# Patient Record
Sex: Female | Born: 1980 | Race: White | Hispanic: No | Marital: Married | Smoking: Current every day smoker
Health system: Southern US, Community
[De-identification: ages and names within clinical notes are randomized; demographics above are authoritative.]

## PROBLEM LIST (undated history)

## (undated) DIAGNOSIS — E282 Polycystic ovarian syndrome: Secondary | ICD-10-CM

## (undated) DIAGNOSIS — F419 Anxiety disorder, unspecified: Secondary | ICD-10-CM

## (undated) DIAGNOSIS — Z9089 Acquired absence of other organs: Secondary | ICD-10-CM

## (undated) DIAGNOSIS — E669 Obesity, unspecified: Secondary | ICD-10-CM

## (undated) DIAGNOSIS — H7193 Unspecified cholesteatoma, bilateral: Secondary | ICD-10-CM

## (undated) DIAGNOSIS — IMO0002 Reserved for concepts with insufficient information to code with codable children: Secondary | ICD-10-CM

## (undated) HISTORY — PX: MASTOIDECTOMY: SHX711

## (undated) HISTORY — PX: TUBAL LIGATION: SHX77

## (undated) HISTORY — PX: LAPAROSCOPIC OVARIAN: SHX5906

## (undated) HISTORY — PX: MASTOIDECTOMY: SUR855

## (undated) HISTORY — PX: DILATION AND CURETTAGE OF UTERUS: SHX78

## (undated) HISTORY — PX: MASTOIDECTOMY REVISION: SUR856

## (undated) HISTORY — DX: Reserved for concepts with insufficient information to code with codable children: IMO0002

---

## 2007-01-28 ENCOUNTER — Ambulatory Visit: Payer: Self-pay | Admitting: Internal Medicine

## 2007-09-15 ENCOUNTER — Ambulatory Visit: Payer: Self-pay | Admitting: Family Medicine

## 2007-09-29 ENCOUNTER — Ambulatory Visit: Payer: Self-pay | Admitting: Emergency Medicine

## 2008-07-23 ENCOUNTER — Ambulatory Visit: Payer: Self-pay | Admitting: Internal Medicine

## 2008-10-11 ENCOUNTER — Ambulatory Visit: Payer: Self-pay | Admitting: Family Medicine

## 2009-03-27 ENCOUNTER — Ambulatory Visit: Payer: Self-pay | Admitting: Internal Medicine

## 2009-05-15 ENCOUNTER — Ambulatory Visit: Payer: Self-pay | Admitting: Internal Medicine

## 2009-12-30 ENCOUNTER — Ambulatory Visit: Payer: Self-pay | Admitting: Internal Medicine

## 2011-03-28 ENCOUNTER — Ambulatory Visit: Payer: Self-pay | Admitting: Family Medicine

## 2011-08-17 ENCOUNTER — Ambulatory Visit: Payer: Self-pay | Admitting: Internal Medicine

## 2012-08-03 NOTE — L&D Delivery Note (Signed)
Delivery Note At 9:47 PM a viable female was delivered via Vaginal, Spontaneous Delivery (Presentation: ; Occiput Anterior).  APGAR: 7, 8; weight 2 lb 11 oz (1219 g).   Placenta status: Intact, Spontaneous.  Cord: 3 vessels.  Anesthesia:  Epidural Episiotomy: None Lacerations: Left periurethral Suture Repair: 3.0 vicryl, one stitch placed Est. Blood Loss (mL): 300 mL  Mom to postpartum.  Baby to NICU.  Tereso Newcomer, MD 06/28/2013, 10:15 PM

## 2012-09-06 ENCOUNTER — Ambulatory Visit: Payer: Self-pay | Admitting: Family Medicine

## 2012-10-18 ENCOUNTER — Ambulatory Visit: Payer: Self-pay | Admitting: Family Medicine

## 2013-01-15 ENCOUNTER — Emergency Department: Payer: Self-pay | Admitting: Internal Medicine

## 2013-01-15 LAB — COMPREHENSIVE METABOLIC PANEL
Albumin: 3.6 g/dL (ref 3.4–5.0)
Alkaline Phosphatase: 109 U/L (ref 50–136)
BUN: 10 mg/dL (ref 7–18)
Bilirubin,Total: 0.3 mg/dL (ref 0.2–1.0)
Creatinine: 0.74 mg/dL (ref 0.60–1.30)
EGFR (African American): >60
EGFR (Non-African Amer.): >60
Osmolality: 272 (ref 275–301)
Potassium: 3.7 mmol/L (ref 3.5–5.1)

## 2013-01-15 LAB — URINALYSIS, COMPLETE
Blood: NEGATIVE
Nitrite: NEGATIVE
Ph: 5 (ref 4.5–8.0)
RBC,UR: <1 /HPF (ref 0–5)
Specific Gravity: 1.013 (ref 1.003–1.030)
Squamous Epithelial: 3

## 2013-01-15 LAB — CBC
HCT: 44.1 % (ref 35.0–47.0)
MCV: 91 fL (ref 80–100)
RDW: 14.4 % (ref 11.5–14.5)
WBC: 16.9 10*3/uL — ABNORMAL HIGH (ref 3.6–11.0)

## 2013-01-15 LAB — HCG, QUANTITATIVE, PREGNANCY: Beta Hcg, Quant.: 7378 m[IU]/mL — ABNORMAL HIGH

## 2013-01-19 ENCOUNTER — Emergency Department: Payer: Self-pay | Admitting: Unknown Physician Specialty

## 2013-01-19 LAB — BASIC METABOLIC PANEL
BUN: 8 mg/dL (ref 7–18)
Calcium, Total: 9.1 mg/dL (ref 8.5–10.1)
Chloride: 106 mmol/L (ref 98–107)
Co2: 27 mmol/L (ref 21–32)
Creatinine: 0.8 mg/dL (ref 0.60–1.30)
EGFR (Non-African Amer.): >60
Potassium: 3.7 mmol/L (ref 3.5–5.1)
Sodium: 137 mmol/L (ref 136–145)

## 2013-01-19 LAB — URINALYSIS, COMPLETE
Bacteria: NONE SEEN
Bilirubin,UR: NEGATIVE
Blood: NEGATIVE
Ketone: NEGATIVE
RBC,UR: <1 /HPF (ref 0–5)
Specific Gravity: 1.01 (ref 1.003–1.030)
Squamous Epithelial: <1

## 2013-01-19 LAB — CBC
HGB: 14.3 g/dL (ref 12.0–16.0)
MCV: 91 fL (ref 80–100)
Platelet: 297 10*3/uL (ref 150–440)
WBC: 13.8 10*3/uL — ABNORMAL HIGH (ref 3.6–11.0)

## 2013-01-19 LAB — HCG, QUANTITATIVE, PREGNANCY: Beta Hcg, Quant.: 12446 m[IU]/mL — ABNORMAL HIGH

## 2013-02-07 LAB — OB RESULTS CONSOLE HEPATITIS B SURFACE ANTIGEN: Hepatitis B Surface Ag: NEGATIVE

## 2013-02-07 LAB — OB RESULTS CONSOLE VARICELLA ZOSTER ANTIBODY, IGG: Varicella: IMMUNE

## 2013-02-07 LAB — OB RESULTS CONSOLE RPR: RPR: NONREACTIVE

## 2013-02-07 LAB — OB RESULTS CONSOLE HIV ANTIBODY (ROUTINE TESTING): HIV: NONREACTIVE

## 2013-02-23 ENCOUNTER — Encounter: Payer: Self-pay | Admitting: Maternal & Fetal Medicine

## 2013-04-04 ENCOUNTER — Emergency Department: Payer: Self-pay | Admitting: Emergency Medicine

## 2013-04-04 LAB — URINALYSIS, COMPLETE
Bacteria: NONE SEEN
Bilirubin,UR: NEGATIVE
Blood: NEGATIVE
Glucose,UR: NEGATIVE mg/dL (ref 0–75)
Ketone: NEGATIVE
Nitrite: NEGATIVE
Protein: NEGATIVE
Specific Gravity: 1.002 (ref 1.003–1.030)
Squamous Epithelial: 1

## 2013-04-04 LAB — COMPREHENSIVE METABOLIC PANEL
Anion Gap: 5 — ABNORMAL LOW (ref 7–16)
BUN: 6 mg/dL — ABNORMAL LOW (ref 7–18)
Calcium, Total: 9.4 mg/dL (ref 8.5–10.1)
Chloride: 104 mmol/L (ref 98–107)
Co2: 24 mmol/L (ref 21–32)
Creatinine: 0.57 mg/dL — ABNORMAL LOW (ref 0.60–1.30)
EGFR (African American): >60
EGFR (Non-African Amer.): >60
SGOT(AST): 24 U/L (ref 15–37)
SGPT (ALT): 40 U/L (ref 12–78)
Sodium: 133 mmol/L — ABNORMAL LOW (ref 136–145)

## 2013-04-04 LAB — CBC
HCT: 40 % (ref 35.0–47.0)
MCH: 30.4 pg (ref 26.0–34.0)
MCV: 91 fL (ref 80–100)
Platelet: 308 10*3/uL (ref 150–440)
RBC: 4.38 10*6/uL (ref 3.80–5.20)
WBC: 19.5 10*3/uL — ABNORMAL HIGH (ref 3.6–11.0)

## 2013-04-04 LAB — LIPASE, BLOOD: Lipase: 103 U/L (ref 73–393)

## 2013-06-12 ENCOUNTER — Inpatient Hospital Stay: Payer: Self-pay | Admitting: Obstetrics and Gynecology

## 2013-06-12 LAB — CBC WITH DIFFERENTIAL/PLATELET
Basophil #: 0.1 10*3/uL (ref 0.0–0.1)
HCT: 37.3 % (ref 35.0–47.0)
HGB: 12.6 g/dL (ref 12.0–16.0)
MCH: 30.6 pg (ref 26.0–34.0)
MCHC: 33.7 g/dL (ref 32.0–36.0)
MCV: 91 fL (ref 80–100)
Monocyte #: 1.1 x10 3/mm — ABNORMAL HIGH (ref 0.2–0.9)
Neutrophil %: 79.5 %
Platelet: 359 10*3/uL (ref 150–440)
RBC: 4.1 10*6/uL (ref 3.80–5.20)
RDW: 14.1 % (ref 11.5–14.5)
WBC: 21 10*3/uL — ABNORMAL HIGH (ref 3.6–11.0)

## 2013-06-13 ENCOUNTER — Encounter (HOSPITAL_COMMUNITY): Payer: Self-pay | Admitting: *Deleted

## 2013-06-13 ENCOUNTER — Inpatient Hospital Stay (HOSPITAL_COMMUNITY)
Admission: AD | Admit: 2013-06-13 | Discharge: 2013-06-27 | DRG: 778 | Disposition: A | Discharge status: Home / Self Care | Payer: BC Managed Care – PPO | Source: Ambulatory Visit | Attending: Obstetrics & Gynecology | Admitting: Obstetrics & Gynecology

## 2013-06-13 DIAGNOSIS — O4700 False labor before 37 completed weeks of gestation, unspecified trimester: Principal | ICD-10-CM | POA: Diagnosis present

## 2013-06-13 DIAGNOSIS — O99330 Smoking (tobacco) complicating pregnancy, unspecified trimester: Secondary | ICD-10-CM | POA: Diagnosis present

## 2013-06-13 DIAGNOSIS — O99810 Abnormal glucose complicating pregnancy: Secondary | ICD-10-CM | POA: Diagnosis present

## 2013-06-13 DIAGNOSIS — O24419 Gestational diabetes mellitus in pregnancy, unspecified control: Secondary | ICD-10-CM

## 2013-06-13 HISTORY — DX: Obesity, unspecified: E66.9

## 2013-06-13 HISTORY — DX: Unspecified cholesteatoma, bilateral: H71.93

## 2013-06-13 HISTORY — DX: Acquired absence of other organs: Z90.89

## 2013-06-13 HISTORY — DX: Polycystic ovarian syndrome: E28.2

## 2013-06-13 HISTORY — DX: Anxiety disorder, unspecified: F41.9

## 2013-06-13 LAB — CBC WITH DIFFERENTIAL/PLATELET
Band Neutrophils: 0 % (ref 0–10)
Basophil #: 0.2 10*3/uL — ABNORMAL HIGH (ref 0.0–0.1)
Basophil %: 0.5 %
Blasts: 0 %
Eosinophil %: 0 %
HCT: 35.1 % (ref 35.0–47.0)
HGB: 11.5 g/dL — ABNORMAL LOW (ref 12.0–16.0)
Lymphocyte %: 7.1 %
Lymphocytes Relative: 9 % — ABNORMAL LOW (ref 12–46)
Lymphs Abs: 2.3 10*3/uL (ref 0.7–4.0)
MCH: 29.9 pg (ref 26.0–34.0)
MCHC: 33.2 g/dL (ref 30.0–36.0)
MCV: 91 fL (ref 80–100)
Monocyte #: 1 x10 3/mm — ABNORMAL HIGH (ref 0.2–0.9)
Monocyte %: 3.4 %
Monocytes Absolute: 0.5 10*3/uL (ref 0.1–1.0)
Monocytes Relative: 2 % — ABNORMAL LOW (ref 3–12)
Neutrophil %: 89 %
Neutrophils Relative %: 89 % — ABNORMAL HIGH (ref 43–77)
Platelet: 360 10*3/uL (ref 150–440)
Promyelocytes Absolute: 0 %
RBC: 3.86 10*6/uL (ref 3.80–5.20)
RBC: 3.77 MIL/uL — ABNORMAL LOW (ref 3.87–5.11)
RDW: 14.2 % (ref 11.5–14.5)
RDW: 14.1 % (ref 11.5–15.5)
WBC: 30.6 10*3/uL — ABNORMAL HIGH (ref 3.6–11.0)
WBC: 25.1 10*3/uL — ABNORMAL HIGH (ref 4.0–10.5)
nRBC: 0 /100 WBC

## 2013-06-13 LAB — COMPREHENSIVE METABOLIC PANEL
ALT: 23 U/L (ref 0–35)
Alkaline Phosphatase: 96 U/L (ref 39–117)
CO2: 23 mEq/L (ref 19–32)
Creatinine, Ser: 0.56 mg/dL (ref 0.50–1.10)
GFR calc Af Amer: >90 mL/min (ref >90)
GFR calc non Af Amer: >90 mL/min (ref >90)
Glucose, Bld: 254 mg/dL — ABNORMAL HIGH (ref 70–99)
Potassium: 4.1 mEq/L (ref 3.5–5.1)
Sodium: 133 mEq/L — ABNORMAL LOW (ref 135–145)
Total Bilirubin: 0.1 mg/dL — ABNORMAL LOW (ref 0.3–1.2)

## 2013-06-13 MED ORDER — DOCUSATE SODIUM 100 MG PO CAPS
100.0000 mg | ORAL_CAPSULE | Freq: Every day | ORAL | Status: DC
  Administered 2013-06-15 – 2013-06-27 (×13): 100 mg via ORAL
  Filled 2013-06-13 (×13): qty 1

## 2013-06-13 MED ORDER — LACTATED RINGERS IV SOLN
INTRAVENOUS | Status: DC
  Administered 2013-06-13 – 2013-06-14 (×2): via INTRAVENOUS

## 2013-06-13 MED ORDER — ZOLPIDEM TARTRATE 5 MG PO TABS
5.0000 mg | ORAL_TABLET | Freq: Every evening | ORAL | Status: DC | PRN
  Administered 2013-06-14 – 2013-06-20 (×5): 5 mg via ORAL
  Filled 2013-06-13 (×6): qty 1

## 2013-06-13 MED ORDER — PRENATAL MULTIVITAMIN CH
1.0000 | ORAL_TABLET | Freq: Every day | ORAL | Status: DC
  Administered 2013-06-15 – 2013-06-27 (×13): 1 via ORAL
  Filled 2013-06-13 (×13): qty 1

## 2013-06-13 MED ORDER — CALCIUM CARBONATE ANTACID 500 MG PO CHEW: 2.0000 | CHEWABLE_TABLET | ORAL | Status: DC | PRN

## 2013-06-13 MED ORDER — MAGNESIUM SULFATE 40 G IN LACTATED RINGERS - SIMPLE
2.0000 g/h | INTRAVENOUS | Status: AC
  Administered 2013-06-13: 2 g/h via INTRAVENOUS
  Filled 2013-06-13: qty 500

## 2013-06-13 NOTE — H&P (Signed)
FACULTY PRACTICE ANTEPARTUM ADMISSION HISTORY AND PHYSICAL NOTE   History of Present Illness: Dana Castillo is a 32 y.o. G4P0030 at [redacted]w[redacted]d transferred from [facility] for concern about preterm labor.  She was admitted yesterday after she was noted to have an advanced cervical exam of 5 cm dilatation with BBOW in the office.  She was given betamethasone and started on magnesium sulfate.  She was also started on Ampicillin.  She was seen by Dr. Pola Corn (Neonatology) who recommended transfer to [facility] in case she progressed in labor as there are more advanced equipment at this NICU to support  < 55 week old infants.  Patient currently denies having any contractions, vaginal bleeding or leaking of fluid. She endorses good FM.    Fetal presentation is cephalic by report, confirmed on bedside ultrasound upon arrival here at [facility].  Prenatal Course Source of Care: [employer] Milon Score CNM)  with onset of care at 10 weeks Pregnancy complications or risks: Patient Active Problem List   Diagnosis Date Noted  . Preterm labor 06/13/2013  Obesity BMI 36 SAB x 3 Hearing Deficits R60%, L30% Anxiety ASCUS +HPV pap on 02/13/13 Increased ROD 1:168 on first trimester screen; low risk Harmony; normal AFP  She plans to breastfeed.  She is undecided for postpartum contraception.   Prenatal labs and studies: ABO, Rh: A/Positive/-- (07/08 0000) Antibody: Negative (07/08 0000) Rubella: Immune (07/08 0000) RPR: Nonreactive (07/08 0000)  HBsAg: Negative (07/08 0000)  HIV: Non-reactive (07/08 0000)  GBS: Unknown 1 hr Glucola  Early 86 Genetic screening normal first trimester screening and Harmony testing Anatomy US normal  Patient Active Problem List   Diagnosis Date Noted  . Preterm labor 06/13/2013    Past Medical History  Diagnosis Date  . Anxiety     Past Surgical History  Procedure Laterality Date  . Dilation and curettage of uterus    Multiple ear surgeries  OB History   Grav Para Term  Preterm Abortions TAB SAB Ect Mult Living   4    3  3          History   Social History  . Marital Status: Single    Spouse Name: N/A    Number of Children: N/A  . Years of Education: N/A   Social History Main Topics  . Smoking status: Current Every Day Smoker -- 0.50 packs/day    Types: Cigarettes  . Smokeless tobacco: None  . Alcohol Use: No  . Drug Use: No  . Sexual Activity: No   Other Topics Concern  . None   Social History Narrative  . None    No family history on file.  Allergies  Allergen Reactions  . Codeine Nausea And Vomiting  . Vicodin [Hydrocodone-Acetaminophen] Itching    No prescriptions prior to admission    . docusate sodium  100 mg Oral Daily  . [START ON 06/14/2013] prenatal multivitamin  1 tablet Oral Q1200   I have reviewed the patient's current medications.  Review of Systems - Negative except what is mentioned in HPI  Vitals:  BP 135/66  Pulse 84  Resp 98  Ht 5\' 4"  (1.626 m)  Wt 226 lb (102.513 kg)  BMI 38.77 kg/m2 Physical Examination: General appearance - alert, well appearing, and in no distress Chest - clear to auscultation, no wheezes, rales or rhonchi, symmetric air entry Heart - normal rate and regular rhythm Abdomen - soft, obese, nontender, nondistended, no masses or organomegaly Pelvic - examination not indicated Extremities - peripheral pulses  normal, no pedal edema, no clubbing or cyanosis, 2+ DTRs, negative Homan's sign Fetal Monitoring:Baseline: 140 bpm, Variability: moderate, Accelerations: Reactive and Decelerations: Absent  Tocometry - flat Bedside ultrasound - Cephalic presentation  Assessment and Plan: Patient Active Problem List   Diagnosis Date Noted  . Preterm labor 06/13/2013  Admit to L&D for now, may transfer to Antenatal Unit later if stable Continue magnesium sulfate until she is 48 hours after initial BMZ dose (06/14/13 at 1630); then ascertain need for further tocolysis. If needed, Procardia can be  considered for further tocolysis No current signs of active preterm labor, will hold antibiotics for GBS prophylaxis for now.  Unable to test for GBS now as patient has already received multiple doses of Ampicillin. NICU and MFM consults ordered; will follow up recommendations Routine antenatal/L&D care   Jaynie Collins, MD, FACOG Attending Obstetrician & Gynecologist Faculty Practice, [facility] [place]

## 2013-06-14 ENCOUNTER — Encounter (HOSPITAL_COMMUNITY): Payer: Self-pay | Admitting: *Deleted

## 2013-06-14 ENCOUNTER — Inpatient Hospital Stay (HOSPITAL_COMMUNITY): Payer: BC Managed Care – PPO

## 2013-06-14 LAB — OB RESULTS CONSOLE GBS: GBS: NEGATIVE

## 2013-06-14 MED ORDER — ACETAMINOPHEN 325 MG PO TABS
650.0000 mg | ORAL_TABLET | Freq: Four times a day (QID) | ORAL | Status: DC | PRN
  Administered 2013-06-14 – 2013-06-24 (×9): 650 mg via ORAL
  Filled 2013-06-14 (×9): qty 2

## 2013-06-14 MED ORDER — NIFEDIPINE ER 30 MG PO TB24: 30.0000 mg | ORAL_TABLET | Freq: Two times a day (BID) | ORAL | Status: DC

## 2013-06-14 MED ORDER — NIFEDIPINE ER 30 MG PO TB24
30.0000 mg | ORAL_TABLET | Freq: Two times a day (BID) | ORAL | Status: DC
  Administered 2013-06-14 – 2013-06-27 (×26): 30 mg via ORAL
  Filled 2013-06-14 (×26): qty 1

## 2013-06-14 NOTE — Progress Notes (Signed)
FACULTY PRACTICE ANTEPARTUM COMPREHENSIVE PROGRESS NOTE  Dana Castillo is a 32 y.o. G4P0030 at [redacted]w[redacted]d who is admitted for Preterm labor.  Estimated Date of Delivery: 09/05/13 Fetal presentation is cephalic.  Length of Stay:  1 Days. 06/13/2013  Subjective: No sentinel events overnight. Patient reports good fetal movement.  She reports no uterine contractions, no bleeding and no loss of fluid per vagina.  Vitals:  Blood pressure 106/36, pulse 75, temperature 98.6 F (37 C), temperature source Oral, resp. rate 18, height 5\' 4"  (1.626 m), weight 226 lb (102.513 kg), last menstrual period 11/29/2012. Physical Examination: General appearance - alert, well appearing, and in no distress Chest - clear to auscultation, no wheezes, rales or rhonchi, symmetric air entry Heart - normal rate and regular rhythm Abdomen - soft, gravid, nontender, nondistended, no masses or organomegaly Cervical Exam: Not evaluated.  Extremities: extremities normal, atraumatic, no cyanosis or edema and Homans sign is negative, no sign of DVT with DTRs 2+ bilaterally Membranes:intact  Fetal Monitoring:  Baseline: 135 bpm, Variability: Moderate, Accelerations: Reactive and Decelerations: Absent Tocometry: Flat  Results for orders placed during the hospital encounter of 06/13/13 (from the past 48 hour(s))  TYPE AND SCREEN     Status: None   Collection Time    06/13/13  9:45 PM      Result Value Range   ABO/RH(D) A POS     Antibody Screen NEG     Sample Expiration 06/16/2013    COMPREHENSIVE METABOLIC PANEL     Status: Abnormal   Collection Time    06/13/13  9:45 PM      Result Value Range   Sodium 133 (*) 135 - 145 mEq/L   Potassium 4.1  3.5 - 5.1 mEq/L   Chloride 100  96 - 112 mEq/L   CO2 23  19 - 32 mEq/L   Glucose, Bld 254 (*) 70 - 99 mg/dL   BUN 5 (*) 6 - 23 mg/dL   Creatinine, Ser 1.61  0.50 - 1.10 mg/dL   Calcium 6.7 (*) 8.4 - 10.5 mg/dL   Total Protein 6.0  6.0 - 8.3 g/dL   Albumin 2.3 (*) 3.5 - 5.2  g/dL   AST 19  0 - 37 U/L   ALT 23  0 - 35 U/L   Alkaline Phosphatase 96  39 - 117 U/L   Total Bilirubin 0.1 (*) 0.3 - 1.2 mg/dL   GFR calc non Af Amer >90  >90 mL/min   GFR calc Af Amer >90  >90 mL/min   Comment: (NOTE)     The eGFR has been calculated using the CKD EPI equation.     This calculation has not been validated in all clinical situations.     eGFR's persistently <90 mL/min signify possible Chronic Kidney     Disease.  CBC WITH DIFFERENTIAL     Status: Abnormal   Collection Time    06/13/13  9:45 PM      Result Value Range   WBC 25.1 (*) 4.0 - 10.5 K/uL   Comment: REPEATED TO VERIFY     WHITE COUNT CONFIRMED ON SMEAR   RBC 3.77 (*) 3.87 - 5.11 MIL/uL   Hemoglobin 11.4 (*) 12.0 - 15.0 g/dL   HCT 09.6 (*) 04.5 - 40.9 %   MCV 91.0  78.0 - 100.0 fL   MCH 30.2  26.0 - 34.0 pg   MCHC 33.2  30.0 - 36.0 g/dL   RDW 81.1  91.4 - 78.2 %   Platelets  326  150 - 400 K/uL   Neutrophils Relative % 89 (*) 43 - 77 %   Lymphocytes Relative 9 (*) 12 - 46 %   Monocytes Relative 2 (*) 3 - 12 %   Eosinophils Relative 0  0 - 5 %   Basophils Relative 0  0 - 1 %   Band Neutrophils 0  0 - 10 %   Metamyelocytes Relative 0     Myelocytes 0     Promyelocytes Absolute 0     Blasts 0     nRBC 0  0 /100 WBC   Neutro Abs 22.3 (*) 1.7 - 7.7 K/uL   Lymphs Abs 2.3  0.7 - 4.0 K/uL   Monocytes Absolute 0.5  0.1 - 1.0 K/uL   Eosinophils Absolute 0.0  0.0 - 0.7 K/uL   Basophils Absolute 0.0  0.0 - 0.1 K/uL   WBC Morphology ABSOLUTE LYMPHOCYTOSIS    ABO/RH     Status: None   Collection Time    06/13/13  9:45 PM      Result Value Range   ABO/RH(D) A POS     Imaging: No results found.  Current scheduled medications . docusate sodium  100 mg Oral Daily  . prenatal multivitamin  1 tablet Oral Q1200   I have reviewed the patient's current medications.  ASSESSMENT: Patient Active Problem List   Diagnosis Date Noted  . Preterm labor 06/13/2013  Leukocytosis, but no overt signs of  chorioamnionitis  PLAN: No progression of preterm labor; cervical exam not done as it is not indicated Elevated WBC with left shift, but no uterine tenderness, no maternal/fetal tachycardia; will continue to monitor for overt signs of chorioamnionitis as this will be an indication for delivery May transfer to Antenatal Unit later if remains stable  Continue magnesium sulfate until she is 48 hours after initial BMZ dose (06/14/13 at 1630); then ascertain need for further tocolysis. If needed, Procardia can be considered for further tocolysis  Will hold antibiotics for GBS prophylaxis for now. Will have team/RN call [facility] and check for GBS status, patient reports this was checked at admission Will follow up recommendations from MFM, will also follow up ultrasound results  Will continue routine antenatal care.    Jaynie Collins, MD, FACOG Attending Obstetrician & Gynecologist Faculty Practice, [facility]

## 2013-06-14 NOTE — Anesthesia Preprocedure Evaluation (Signed)
Anesthesia Evaluation  Patient identified by MRN, date of birth, ID band Patient awake    Reviewed: Allergy & Precautions, H&P , Patient's Chart, lab work & pertinent test results  Airway Mallampati: III TM Distance: >3 FB Neck ROM: Full    Dental no notable dental hx. (+) Teeth Intact   Pulmonary neg pulmonary ROS, Current Smoker,  breath sounds clear to auscultation  Pulmonary exam normal       Cardiovascular negative cardio ROS  Rhythm:Regular Rate:Normal     Neuro/Psych Anxiety negative psych ROS   GI/Hepatic negative GI ROS, Neg liver ROS,   Endo/Other  negative endocrine ROSMorbid obesityPCOS  Renal/GU negative Renal ROS  negative genitourinary   Musculoskeletal negative musculoskeletal ROS (+)   Abdominal (+) + obese,   Peds  Hematology   Anesthesia Other Findings   Reproductive/Obstetrics (+) Pregnancy                           Anesthesia Physical Anesthesia Plan  ASA: III  Anesthesia Plan: Epidural   Post-op Pain Management:    Induction:   Airway Management Planned: Natural Airway  Additional Equipment:   Intra-op Plan:   Post-operative Plan:   Informed Consent: I have reviewed the patients History and Physical, chart, labs and discussed the procedure including the risks, benefits and alternatives for the proposed anesthesia with the patient or authorized representative who has indicated his/her understanding and acceptance.     Plan Discussed with: Anesthesiologist  Anesthesia Plan Comments:         Anesthesia Quick Evaluation

## 2013-06-14 NOTE — Consult Note (Signed)
Asked by Dr Macon Large to consult on Dana Castillo to provide neonatal info due to PTL at 28 weeks. Patient was seen by Dr Wayland Denis, on 06/12/13 in pm at [facility]. I can provide you a copy of the consult if this is helpful. If needed, we can also provide a follow up consult on this patient.  Thank you.  Lucillie Garfinkel, MD Neonatologist

## 2013-06-14 NOTE — Progress Notes (Signed)
I received a referral from RN to see pt to discuss her wedding plans--she had planned to go [place] [employer] [place] to be married but had not anticipated being in the hospital.  I made an initial contact with pt to talk about her wedding plans.  Staff are involved and AC, Okey Regal, has a phone number to reach me to discuss plans!  I will consult with Chaplain Rush Barer who will be here tomorrow.  [employer] Pager, [phone] 12:23 PM

## 2013-06-14 NOTE — Consult Note (Signed)
Neonatology consultation done 06/12/13 ar [facility]  Asked by Dr. Feliberto Gottron to provide prenatal consultation for this 32 y.o  G4 P 0 Ab 3 mother  at 100 5/[redacted] wks EGA after she was admitted in preterm labor with bulging membranes and cervical dilatation to 5 cm.  She has no signs of infection and she has been given a dose of betamethasone and started on Magnesium sulfate and antibiotics.  Talked with patient and the FOB about usual treatment and expectations for preterm infants at 50 - 28 wks, including possible need for resuscitation, respiratory support, long-term IV access, hypothermia, feeding problems, and antibiotics.  Told them most babies born at this GA survived without major complications, but that there were many potential problems. Presented uncertain LOS, possibly until 36 - [redacted] wks EGA.  Discussed desirability of feeding mother's milk - she plans to pump after delivery.  Patient and FOB were attentive and asked appropriate questions and expressed appreciation for my input.  Thank you for consulting Neonatology   JWimmer,MD  face-to-face time 20 minutes

## 2013-06-14 NOTE — Progress Notes (Signed)
Inpatient Diabetes Program Recommendations  AACE/ADA: New Consensus Statement on Inpatient Glycemic Control (2013)  Target Ranges:  Prepandial:   less than 140 mg/dL      Peak postprandial:   less than 180 mg/dL (1-2 hours)      Critically ill patients:  140 - 180 mg/dL   Reason for Visit: Noted elevated blood glucose of 254 mg/dl on 47/82/95 @ 62:13.  Results for JACKOLYN, GERON (MRN [id number]) as of 06/14/2013 07:23  Ref. Range 06/13/2013 21:45  Glucose Latest Range: 70-99 mg/dL 629 (H)   Inpatient Diabetes Program Recommendations Correction (SSI): Please consider ordering CBGs before meals, 2 hours post prandial, at bedtime, and at 3:00 am.  Nursing: If fasting glucose >90 mg/dl or any other glucose values >120 mg/dl then call MD for glycemic control order set. HgbA1C: Please consider ordering an A1C to determine glycemic control over the past 2-3 months. Diet: Please consider changing diet to Gestational Carb Modified and if appropriate consult RD for diet education.  Note: Noted blood glucose of 254 mg/dl on labs yesterday.  In reviewing the chart, patient does not have a documented history of diabetes and does not take any medications as an outpatient for diabetes management.  Please consider ordering an A1C to determine glycemic control over the past 2-3 months.  Please order CBGs as requested and consider changing diet to Gestational Carb Modified diet.    Thanks, Orlando Penner, RN, MSN, CCRN Diabetes Coordinator Inpatient Diabetes Program [phone] (Team Pager) [phone] (AP office) [phone] [facility] office)

## 2013-06-14 NOTE — Consult Note (Signed)
MFM Note  [name] is a 32 y/o G51P0A3 Caucasian female at 28+1 weeks who was transferred to [facility] last night from [facility]  for advanced cervical dilation. Her prenatal course had been uneventful until Monday afternoon when she presented to the [facility] office c/o a clear mucous discharge x 2-3 day with only a rare contraction. On digital exam, her cervix was found to be 5 cms dilated. She was admitted at that time to [facility] and was given a course of BMZ and magnesium. Dr. Eric Castillo as consulted and suggested she be transferred. Her magnesium was continued until this afternoon (48 hours).  Currently, she denies any contractions. Reports excellent fetal movement. Had low risk NIPS after an abnormal first trimester screen  Ultrasound today: (suboptimal study); no gross abnormalities; normal AFV, EFW at the 23rd %tile; cervix not evaluated  OB history: 3 SABs Med history: NA; Surg history: D&Cs Allergies: none; Codeine - GI disturbance; Vicodin - itching Social history: works as a Archivist; smokes between 1/2 to 1 ppd; no ETOH or drugs  Assessment: 1) IUP at 28+1 weeks 2) Advanced cervical dilation without significant uterine activity 3) History of recurrent abortions 4) S/P BMZ and magnesium 5) GBS - negative 6) Tobacco use  Recommendations: 1) Endovaginal [place] to evaluate cervix/membranes this Friday at [facility]; also attempt to visualize more fetal anatomy 2) If no change in cervical exam in several days and no significant uterine activity, consider outpt management; she lives close to [facility] and is a compliant patient 3) Observe for s/s of IU infection 4) Encourage tobacco cessation  Thank you for the kind referral. Call with questions or concerns.  (Face-to-face consultation with patient: 30 min)

## 2013-06-14 NOTE — Progress Notes (Signed)
Dana Castillo is a 32 y.o. G4P0030 at [redacted]w[redacted]d admitted for threatened PTL  Subjective:  Pt comfortable after sleeping all night and napping this AM.  She is not feeling any contractions. No vb, lof. +FM.   Objective: BP 122/58  Pulse 80  Temp(Src) 98.8 F (37.1 C) (Oral)  Resp 20  Ht 5\' 4"  (1.626 m)  Wt 102.513 kg (226 lb)  BMI 38.77 kg/m2  LMP 11/29/2012 I/O last 3 completed shifts: In: 1726.7 [P.O.:840; I.V.:886.7] Out: 1395 [Urine:1395] Total I/O In: 540 [P.O.:240; I.V.:300] Out: 400 [Urine:400]  FHT:  FHR: 130 bpm, variability: moderate,  accelerations:  Present,  decelerations:  Absent UC:   One in the last several hours but not felt by pt SVE:   Dilation: 5  Labs: Lab Results  Component Value Date   WBC 25.1* 06/13/2013   HGB 11.4* 06/13/2013   HCT 34.3* 06/13/2013   MCV 91.0 06/13/2013   PLT 326 06/13/2013    Assessment / Plan: threatened PTL  1) threatened PTL - s/p BMZ x2 - will plan to d/c tocolytic mag at 1630  - transfer to antenatal  2) FWB - cat I tracing - GBS collected at [facility] - called and spoke to lab and result was negative    [name] L 06/14/2013, 11:34 AM

## 2013-06-15 ENCOUNTER — Encounter (HOSPITAL_COMMUNITY): Payer: Self-pay | Admitting: *Deleted

## 2013-06-15 DIAGNOSIS — O99330 Smoking (tobacco) complicating pregnancy, unspecified trimester: Secondary | ICD-10-CM

## 2013-06-15 DIAGNOSIS — O4700 False labor before 37 completed weeks of gestation, unspecified trimester: Secondary | ICD-10-CM

## 2013-06-15 DIAGNOSIS — O99810 Abnormal glucose complicating pregnancy: Secondary | ICD-10-CM

## 2013-06-15 LAB — GLUCOSE, CAPILLARY: Glucose-Capillary: 76 mg/dL (ref 70–99)

## 2013-06-15 NOTE — Progress Notes (Signed)
06/15/13 1500  Clinical Encounter Type  Visited With Patient;Health care provider Candida Peeling, RN)  Visit Type Follow-up;Spiritual support;Social support  Referral From Chaplain  Recommendations Spiritual Care is helping pt plan wedding & plans to officiate.  Spiritual Encounters  Spiritual Needs Emotional;Ritual (planning wedding)   Ayshia was in good spirits during this follow-up visit, coping well with her unexpected and rush admission to [facility].  We discussed many details possible for her wedding, which she hopes to hold in the chapel (pending MD approval) on Sunday afternoon.  Milarose plans to discuss with her fiance Vernard Gambles, and then we will make a firmer plan together tomorrow.  Criss Alvine in dietary is on board with a plan for cake and a special meal for the couple, and she will be working on Sunday. Please page as further spiritual/emotional/ritual needs arise:  [phone].  Thank you.  [address] [place], [place] [phone]

## 2013-06-15 NOTE — Progress Notes (Signed)
UR completed 

## 2013-06-15 NOTE — Progress Notes (Signed)
FACULTY PRACTICE ANTEPARTUM(COMPREHENSIVE) NOTE  Dana Castillo is a 32 y.o. G4P0030 at [redacted]w[redacted]d  who is admitted for threatened PTL, .Advanced cervical dilation , without current uterine activity   Fetal presentation is cephalic. Length of Stay:  2  Days  Subjective: No uterine activity Patient reports the fetal movement as active. Patient reports uterine contraction  activity as none. Patient reports  vaginal bleeding as none. Patient describes fluid per vagina as None.  Vitals:  Blood pressure 114/49, pulse 68, temperature 98.3 F (36.8 C), temperature source Oral, resp. rate 18, height 5\' 4"  (1.626 m), weight 102.513 kg (226 lb), last menstrual period 11/29/2012. Physical Examination:  General appearance - alert, well appearing, and in no distress Heart - normal rate and regular rhythm Abdomen - soft, nontender, nondistended Fundal Height:  size equals dates Cervical Exam: Not evaluated. Previously 5/BBOW Extremities: extremities normal, atraumatic, no cyanosis or edema and Homans sign is negative, no sign of DVT with DTRs 2+ bilaterally Membranes:intact  Fetal Monitoring:  Nst q shift Labs:  Results for orders placed during the hospital encounter of 06/13/13 (from the past 24 hour(s))  GLUCOSE, CAPILLARY   Collection Time    06/15/13  7:43 AM      Result Value Range   Glucose-Capillary 76  70 - 99 mg/dL    Imaging Studies:    *  Medications:  Scheduled . docusate sodium  100 mg Oral Daily  . NIFEdipine  30 mg Oral BID  . prenatal multivitamin  1 tablet Oral Q1200   I have reviewed the patient's current medications.  ASSESSMENT: Patient Active Problem List   Diagnosis Date Noted  . Preterm labor 06/13/2013  Adv cervical dilation  PLAN: MFM repeat u/s Friday. Consider outpt management if stable after Friday u.s, versus prolonged inpt care  Kalimah Capurro V 06/15/2013,7:56 AM

## 2013-06-15 NOTE — Progress Notes (Signed)
Epidural charted on wrong patient. Pt is without epidural or epidural consult.

## 2013-06-16 ENCOUNTER — Inpatient Hospital Stay (HOSPITAL_COMMUNITY): Payer: BC Managed Care – PPO

## 2013-06-16 NOTE — Progress Notes (Signed)
Patient ID: Dana Castillo, female   DOB: 09/25/1980, 32 y.o.   MRN: [id number] FACULTY PRACTICE ANTEPARTUM(COMPREHENSIVE) NOTE  Dana Castillo is a 32 y.o. G4P0030 at [redacted]w[redacted]d by LMP who is admitted for Preterm labor.   Fetal presentation is cephalic. Length of Stay:  3  Days  Subjective: No contractions Patient reports the fetal movement as active. Patient reports uterine contraction  activity as none. Patient reports  vaginal bleeding as none. Patient describes fluid per vagina as Clear.mucus  Vitals:  Blood pressure 124/56, pulse 76, temperature 98.3 F (36.8 C), temperature source Oral, resp. rate 18, height 5\' 4"  (1.626 m), weight 226 lb (102.513 kg), last menstrual period 11/29/2012. Physical Examination:  General appearance - alert, well appearing, and in no distress Heart - normal rate and regular rhythm Abdomen - soft, nontender, nondistended Fundal Height:  size equals dates Cervical Exam: Not evaluated Extremities: extremities normal, atraumatic, no cyanosis or edema and Homans sign is negative, no sign of DVT Membranes:intact  Fetal Monitoring:  Baseline: 145 bpm, Variability: Good {> 6 bpm) and Accelerations: Reactive  Labs:  No results found for this or any previous visit (from the past 24 hour(s)).  Imaging Studies:     Currently EPIC will not allow sonographic studies to automatically populate into notes.  In the meantime, copy and paste results into note or free text.  Medications:  Scheduled . docusate sodium  100 mg Oral Daily  . NIFEdipine  30 mg Oral BID  . prenatal multivitamin  1 tablet Oral Q1200   I have reviewed the patient's current medications.  ASSESSMENT: Patient Active Problem List   Diagnosis Date Noted  . Preterm labor 06/13/2013  Advanced dilation  PLAN: MFM [place] today  Dana Castillo 06/16/2013,8:10 AM

## 2013-06-16 NOTE — Progress Notes (Signed)
06/16/13 1700  Clinical Encounter Type  Visited With Patient  Visit Type (plans for wedding in chapel, Sunday 11/16 at 2pm)  Spiritual Encounters  Spiritual Needs Emotional;Ritual (planning wedding)   Visited with Dana Castillo twice this afternoon to discuss details and simple arrangements for her wedding in the hospital chapel on Sunday.    Have consulted with many staff members in making preparations, per Dana Castillo/hospital procedure, including  RNs, Dr Debroah Loop, Security, Criss Alvine in Dietary, Meta Hatchet - VP of Nursing, Magda Bernheim in Marketing.  Plan is for Dana Castillo to be present in chapel (in wheelchair) with 10-12 family members for a very simple service and then cake just outside the chapel, ca one hour (or less) total.  I plan to be at the hospital by 1:00pm on Sunday and to check in with ante and Dana Castillo/family by 1:30pm. Staff may reach me by personal cell (number at ante desk) with questions.  Thank you!  [address] [place], [place] [phone]

## 2013-06-17 NOTE — Progress Notes (Signed)
Patient ID: Dana Castillo, female   DOB: 08/25/1980, 32 y.o.   MRN: [id number] Patient ID: Dana Castillo, female   DOB: 23-Sep-1980, 32 y.o.   MRN: [id number] FACULTY PRACTICE ANTEPARTUM(COMPREHENSIVE) NOTE  Dana Castillo is a 32 y.o. G4P0030 at 43w4dby LMP who is admitted for Preterm labor.   Fetal presentation is cephalic. Length of Stay:  4  Days 06/13/2013  7:18 PM  Subjective: No contractions Patient reports the fetal movement as active. Patient reports uterine contraction  activity as none. Patient reports  vaginal bleeding as none. Patient describes fluid per vagina as Clear.mucus  Vitals:  Blood pressure 140/61, pulse 90, temperature 98.5 F (36.9 C), temperature source Oral, resp. rate 18, height 5\' 4"  (1.626 m), weight 226 lb (102.513 kg), last menstrual period 11/29/2012. Physical Examination:  General appearance - alert, well appearing, and in no distress Heart - normal rate and regular rhythm Abdomen - soft, nontender, nondistended Fundal Height:  size equals dates Cervical Exam: Not evaluated Extremities: extremities normal, atraumatic, no cyanosis or edema and Homans sign is negative, no sign of DVT Membranes:intact  Fetal Monitoring:  Baseline: 145 bpm, Variability: Good {> 6 bpm) and Accelerations: Reactive  Labs:  No results found for this or any previous visit (from the past 24 hour(s)).  Imaging Studies:     Currently EPIC will not allow sonographic studies to automatically populate into notes.  In the meantime, copy and paste results into note or free text.  Medications:  Scheduled . docusate sodium  100 mg Oral Daily  . NIFEdipine  30 mg Oral BID  . prenatal multivitamin  1 tablet Oral Q1200   I have reviewed the patient's current medications.  ASSESSMENT: Patient Active Problem List   Diagnosis Date Noted  . Preterm labor 06/13/2013  Advanced dilation  Continue current care plan  Jessicaann Overbaugh H 06/17/2013,10:30 PM

## 2013-06-18 DIAGNOSIS — O4700 False labor before 37 completed weeks of gestation, unspecified trimester: Principal | ICD-10-CM

## 2013-06-18 MED ORDER — POLYETHYLENE GLYCOL 3350 17 G PO PACK
17.0000 g | PACK | Freq: Two times a day (BID) | ORAL | Status: DC
  Administered 2013-06-18 – 2013-06-24 (×12): 17 g via ORAL
  Filled 2013-06-18 (×18): qty 1

## 2013-06-18 NOTE — Progress Notes (Signed)
Patient ID: Dana Castillo, female   DOB: 08-Jan-1981, 32 y.o.   MRN: [id number] FACULTY PRACTICE ANTEPARTUM(COMPREHENSIVE) NOTE  Dana Castillo is a 32 y.o. G4P0030 at [redacted]w[redacted]d by LMP who is admitted for Preterm labor.   Fetal presentation is cephalic. Length of Stay:  5  Days 06/13/2013  7:18 PM  Subjective: No contractions Patient reports the fetal movement as active. Patient reports uterine contraction  activity as none. Patient reports  vaginal bleeding as none. Patient describes fluid per vagina as Clear.mucus  Vitals:  Blood pressure 119/57, pulse 79, temperature 98.5 F (36.9 C), temperature source Oral, resp. rate 20, height 5\' 4"  (1.626 m), weight 226 lb (102.513 kg), last menstrual period 11/29/2012. Physical Examination:  General appearance - alert, well appearing, and in no distress Heart - normal rate and regular rhythm Abdomen - soft, nontender, nondistended Fundal Height:  size equals dates Cervical Exam: Not evaluated Extremities: extremities normal, atraumatic, no cyanosis or edema and Homans sign is negative, no sign of DVT Membranes:intact  Fetal Monitoring:  Baseline: 150 bpm, Variability: Good {> 6 bpm), Accelerations: Reactive and Decelerations: Absent Toco: no contractions  Labs:  No results found for this or any previous visit (from the past 24 hour(s)).  Imaging Studies:     Currently EPIC will not allow sonographic studies to automatically populate into notes.  In the meantime, copy and paste results into note or free text.  Medications:  Scheduled . docusate sodium  100 mg Oral Daily  . NIFEdipine  30 mg Oral BID  . prenatal multivitamin  1 tablet Oral Q1200   I have reviewed the patient's current medications.  ASSESSMENT: Patient Active Problem List   Diagnosis Date Noted  . Preterm labor 06/13/2013  Advanced dilation   Continue current care plan Patient already s/p mag sulfate for CP prophylaxis and BMZ Nasiah Polinsky 06/18/2013,7:05  AM

## 2013-06-18 NOTE — Progress Notes (Signed)
Patient off unit to chapel for wedding ceremony.

## 2013-06-19 LAB — URINALYSIS, ROUTINE W REFLEX MICROSCOPIC
Bilirubin Urine: NEGATIVE
Glucose, UA: NEGATIVE mg/dL
Ketones, ur: NEGATIVE mg/dL
Urobilinogen, UA: 0.2 mg/dL (ref 0.0–1.0)
pH: 6.5 (ref 5.0–8.0)

## 2013-06-19 LAB — URINE MICROSCOPIC-ADD ON

## 2013-06-19 NOTE — Progress Notes (Signed)
Patient ID: Dana Castillo, female   DOB: February 18, 1981, 32 y.o.   MRN: [id number] Patient ID: Dana Castillo, female   DOB: [birth year], 32 y.o.   MRN: [id number] Patient ID: Dana Castillo, female   DOB: 29-Aug-1980, 32 y.o.   MRN: [id number] FACULTY PRACTICE ANTEPARTUM(COMPREHENSIVE) NOTE  Dana Castillo is a 32 y.o. G4P0030 at [redacted]w[redacted]d by LMP who is admitted for Preterm labor.   Fetal presentation is cephalic. Length of Stay:  6  Days 06/13/2013  7:18 PM  Subjective: No contractions, some back ache which is chronic, some frequency Patient reports the fetal movement as active. Patient reports uterine contraction  activity as none. Patient reports  vaginal bleeding as none. Patient describes fluid per vagina as Clear.mucus  Vitals:  Blood pressure 114/53, pulse 81, temperature 98.4 F (36.9 C), temperature source Oral, resp. rate 18, height 5\' 4"  (1.626 m), weight 226 lb (102.513 kg), last menstrual period 11/29/2012. Physical Examination:  General appearance - alert, well appearing, and in no distress Heart - normal rate and regular rhythm Abdomen - soft, nontender, nondistended Fundal Height:  size equals dates Cervical Exam: Not evaluated Extremities: extremities normal, atraumatic, no cyanosis or edema and Homans sign is negative, no sign of DVT Membranes:intact  Fetal Monitoring:  Baseline: 145 bpm, Variability: Good {> 6 bpm) and Accelerations: Reactive  Labs:  No results found for this or any previous visit (from the past 24 hour(s)).  Imaging Studies:     Currently EPIC will not allow sonographic studies to automatically populate into notes.  In the meantime, copy and paste results into note or free text.  Medications:  Scheduled . docusate sodium  100 mg Oral Daily  . NIFEdipine  30 mg Oral BID  . polyethylene glycol  17 g Oral BID  . prenatal multivitamin  1 tablet Oral Q1200   I have reviewed the patient's current medications.  ASSESSMENT: Patient Active Problem List   Diagnosis  Date Noted  . Preterm labor 06/13/2013  Advanced dilation  Continue current care plan: Status post Betamethasone and Magnesium prophylaxis Neonatology consult done  Hildegarde Dunaway H 06/19/2013,7:52 AM

## 2013-06-19 NOTE — Progress Notes (Signed)
Right wrist from previous IV site swollen and red tender and warm to touch

## 2013-06-19 NOTE — Progress Notes (Signed)
I had a brief visit with Dana Castillo today.  She was tired from not having slept well the night before, but she was in good spirits.  She reported that she was so grateful for everyone who worked hard to make her wedding here at the hospital special yesterday.    She is remaining optimistic about keeping her baby in for a little longer and we will continue to follow her for support.  [address] [place] Pager, [phone] 3:43 PM   06/19/13 1500  Clinical Encounter Type  Visited With Patient  Visit Type Follow-up

## 2013-06-19 NOTE — Progress Notes (Signed)
Antenatal Nutrition Assessment:  Currently  28 6/[redacted] weeks gestation, with PTL, cervical changes. Height  64" Weight 226 lbs pre-pregnancy weight unknown.Pre-pregnancy  BMI unknown  IBW 120 lbs  Total weight gain unknown. Weight gain goals 11-20 lbs.   Estimated needs: 2000-2200 kcal/day, 74-84 grams protein/day, 2.3 liters fluid/day  Antenatal regular diet tolerated well, appetite good. Pt asleep at time of visit. Left copies of snack and retail menus at bedside Current diet prescription will provide for increased needs.  No abnormal nutrition related labs  Nutrition Dx: Increased nutrient needs r/t pregnancy and fetal growth requirements aeb [redacted] weeks gestation.  No educational needs assessed at this time.  Elisabeth Cara M.Odis Luster LDN Neonatal Nutrition Support Specialist Pager [phone]

## 2013-06-19 NOTE — Progress Notes (Signed)
Ur chart review completed.  

## 2013-06-20 NOTE — Progress Notes (Signed)
Patient ID: Dana Castillo, female   DOB: June 01, 1981, 32 y.o.   MRN: [id number] FACULTY PRACTICE ANTEPARTUM(COMPREHENSIVE) NOTE  Dana Castillo is a 32 y.o. G4P0030 at [redacted]w[redacted]d by best clinical estimate who is admitted for Preterm labor.   Fetal presentation is cephalic. Length of Stay:  7  Days  Subjective: Doing well. Patient reports the fetal movement as active. Patient reports uterine contraction  activity as none. Patient reports  vaginal bleeding as none. Patient describes fluid per vagina as None.  Vitals:  Blood pressure 123/47, pulse 78, temperature 98.4 F (36.9 C), temperature source Oral, resp. rate 18, height 5\' 4"  (1.626 m), weight 226 lb (102.513 kg), last menstrual period 11/29/2012. Physical Examination:  General appearance - alert, well appearing, and in no distress Abdomen - soft, nontender, nondistended, no masses or organomegaly Fundal Height:  size equals dates Pelvic Exam:  normal external genitalia, vulva, vagina, cervix, uterus and adnexa Extremities: extremities normal, atraumatic, no cyanosis or edema  Membranes:intact  Fetal Monitoring:  Baseline: 150 bpm, Variability: Good {> 6 bpm), Accelerations: Non-reactive but appropriate for gestational age and Decelerations: Absent  Labs:  Results for orders placed during the hospital encounter of 06/13/13 (from the past 24 hour(s))  URINALYSIS, ROUTINE W REFLEX MICROSCOPIC   Collection Time    06/19/13  9:40 AM      Result Value Range   Color, Urine YELLOW  YELLOW   APPearance CLEAR  CLEAR   Specific Gravity, Urine 1.025  1.005 - 1.030   pH 6.5  5.0 - 8.0   Glucose, UA NEGATIVE  NEGATIVE mg/dL   Hgb urine dipstick SMALL (*) NEGATIVE   Bilirubin Urine NEGATIVE  NEGATIVE   Ketones, ur NEGATIVE  NEGATIVE mg/dL   Protein, ur NEGATIVE  NEGATIVE mg/dL   Urobilinogen, UA 0.2  0.0 - 1.0 mg/dL   Nitrite NEGATIVE  NEGATIVE   Leukocytes, UA SMALL (*) NEGATIVE  URINE MICROSCOPIC-ADD ON   Collection Time    06/19/13  9:40  AM      Result Value Range   Squamous Epithelial / LPF FEW (*) RARE   WBC, UA 7-10  <3 WBC/hpf   RBC / HPF 3-6  <3 RBC/hpf   Bacteria, UA RARE  RARE     Medications:  Scheduled . docusate sodium  100 mg Oral Daily  . NIFEdipine  30 mg Oral BID  . polyethylene glycol  17 g Oral BID  . prenatal multivitamin  1 tablet Oral Q1200   I have reviewed the patient's current medications.  ASSESSMENT: Patient Active Problem List   Diagnosis Date Noted  . Preterm labor 06/13/2013    PLAN: Continue inpt. Stay for now.  PRATT,TANYA S 06/20/2013,7:28 AM

## 2013-06-21 MED ORDER — SODIUM CHLORIDE 0.9 % IJ SOLN
3.0000 mL | Freq: Two times a day (BID) | INTRAMUSCULAR | Status: DC
  Administered 2013-06-21: 3 mL via INTRAVENOUS

## 2013-06-21 NOTE — Progress Notes (Signed)
Patient ID: Dana Castillo, female   DOB: May 04, 1981, 32 y.o.   MRN: [id number] FACULTY PRACTICE ANTEPARTUM(COMPREHENSIVE) NOTE  Dana Castillo is a 32 y.o. G4P0030 at [redacted]w[redacted]d by best clinical estimate who is admitted for Preterm labor.   Fetal presentation is cephalic. Length of Stay:  8  Days  Subjective: No complaints Patient reports the fetal movement as active. Patient reports uterine contraction  activity as none. Patient reports  vaginal bleeding as none. Patient describes fluid per vagina as None.  Vitals:  Blood pressure 136/64, pulse 78, temperature 98.4 F (36.9 C), temperature source Oral, resp. rate 18, height 5\' 4"  (1.626 m), weight 226 lb (102.513 kg), last menstrual period 11/29/2012. Physical Examination:  General appearance - alert, well appearing, and in no distress Heart - normal rate and regular rhythm Abdomen - soft, nontender, nondistended Fundal Height:  size equals dates Cervical Exam: Not evaluated.  Extremities: extremities normal, atraumatic, no cyanosis or edema Membranes:intact  Fetal Monitoring:  Baseline: 145 bpm reactive  Labs:  No results found for this or any previous visit (from the past 24 hour(s)).  Imaging Studies:     Currently EPIC will not allow sonographic studies to automatically populate into notes.  In the meantime, copy and paste results into note or free text.  Medications:  Scheduled . docusate sodium  100 mg Oral Daily  . NIFEdipine  30 mg Oral BID  . polyethylene glycol  17 g Oral BID  . prenatal multivitamin  1 tablet Oral Q1200   I have reviewed the patient's current medications.  ASSESSMENT: Patient Active Problem List   Diagnosis Date Noted  . Preterm labor 06/13/2013    PLAN: Continue observation due to advanced dilation  Dana Castillo 06/21/2013,7:33 AM

## 2013-06-22 LAB — GLUCOSE, CAPILLARY: Glucose-Capillary: 198 mg/dL — ABNORMAL HIGH (ref 70–99)

## 2013-06-22 NOTE — Progress Notes (Signed)
UR completed 

## 2013-06-22 NOTE — Progress Notes (Signed)
Patient ID: Dana Castillo, female   DOB: 04-24-1981, 32 y.o.   MRN: [id number] FACULTY PRACTICE ANTEPARTUM(COMPREHENSIVE) NOTE  Dana Castillo is a 32 y.o. G4P0030 at [redacted]w[redacted]d  who is admitted for advanced cervical dilation.   Length of Stay:  9  Days  Subjective:  Patient reports the fetal movement as active. Patient reports uterine contraction  activity as none. Patient reports  vaginal bleeding as none. Patient describes fluid per vagina as None.  Vitals:  Blood pressure 117/53, pulse 82, temperature 98 F (36.7 C), temperature source Oral, resp. rate 18, height 5\' 4"  (1.626 m), weight 219 lb 14.4 oz (99.746 kg), last menstrual period 11/29/2012. Physical Examination:  General appearance - alert, well appearing, and in no distress Abdomen - soft, gravid, nontender Extremities - no edema, redness or tenderness in the calves or thighs  Fetal Monitoring:  Baseline: 140s bpm, Variability: Good {> 6 bpm), Accelerations: Reactive and Decelerations: Absent   Medications:  Scheduled . docusate sodium  100 mg Oral Daily  . NIFEdipine  30 mg Oral BID  . polyethylene glycol  17 g Oral BID  . prenatal multivitamin  1 tablet Oral Q1200  . sodium chloride  3 mL Intravenous Q12H   I have reviewed the patient's current medications.  ASSESSMENT: Patient Active Problem List   Diagnosis Date Noted  . Preterm labor 06/13/2013    PLAN: Dana Castillo is a 32 y.o. G4P0030 at [redacted]w[redacted]d  who is admitted for advanced cervical dilation.   1-Needs GCT 2-Plan for d/c home at 30 weeks if stable.  Dana Castillo H. 06/22/2013,9:10 AM

## 2013-06-23 LAB — GLUCOSE, CAPILLARY
Glucose-Capillary: 102 mg/dL — ABNORMAL HIGH (ref 70–99)
Glucose-Capillary: 91 mg/dL (ref 70–99)
Glucose-Capillary: 100 mg/dL — ABNORMAL HIGH (ref 70–99)
Glucose-Capillary: 90 mg/dL (ref 70–99)

## 2013-06-23 MED ORDER — SALINE SPRAY 0.65 % NA SOLN
1.0000 | NASAL | Status: DC | PRN
  Administered 2013-06-23: 1 via NASAL
  Filled 2013-06-23: qty 44

## 2013-06-23 NOTE — Progress Notes (Signed)
Patient ID: Dana Castillo, female   DOB: April 02, 1981, 32 y.o.   MRN: [id number]  FACULTY PRACTICE ANTEPARTUM(COMPREHENSIVE) NOTE  Bristol Osentoski is a 32 y.o. G4P0030 at [redacted]w[redacted]d  who is admitted for advanced cervical dilation.   Length of Stay:  10  Days  Subjective: Patient reports the fetal movement as active. Patient reports uterine contraction  activity as none. Patient reports  vaginal bleeding as none. Patient describes fluid per vagina as None.  Vitals:  Blood pressure 124/72, pulse 84, temperature 98.1 F (36.7 C), temperature source Oral, resp. rate 18, height 5\' 4"  (1.626 m), weight 219 lb 14.4 oz (99.746 kg), last menstrual period 11/29/2012. Physical Examination:  General appearance - alert, well appearing, and in no distress Abdomen - soft, NT, gravid Extremities - no edema, redness or tenderness in the calves or thighs, Homan's sign negative bilaterally  Fetal Monitoring:  Baseline: 145 bpm, Variability: Good {> 6 bpm), Accelerations: Reactive and Decelerations: Absent  Labs:  Results for orders placed during the hospital encounter of 06/13/13 (from the past 24 hour(s))  GLUCOSE TOLERANCE, 1 HOUR   Collection Time    06/22/13  1:10 PM      Result Value Range   Glucose, 1 Hour GTT 192 (*) 70 - 140 mg/dL  GLUCOSE, CAPILLARY   Collection Time    06/22/13  8:15 PM      Result Value Range   Glucose-Capillary 198 (*) 70 - 99 mg/dL  GLUCOSE, CAPILLARY   Collection Time    06/23/13  7:37 AM      Result Value Range   Glucose-Capillary 102 (*) 70 - 99 mg/dL    GCT 409  Medications:  Scheduled . docusate sodium  100 mg Oral Daily  . NIFEdipine  30 mg Oral BID  . polyethylene glycol  17 g Oral BID  . prenatal multivitamin  1 tablet Oral Q1200  . sodium chloride  3 mL Intravenous Q12H   I have reviewed the patient's current medications.  ASSESSMENT: Patient Active Problem List   Diagnosis Date Noted  . Preterm labor 06/13/2013    PLAN: Shaela Boer is a 32 y.o.  G4P0030 at [redacted]w[redacted]d  who is admitted for advanced cervical dilation on bedrest and newly diagnosed Gestation Diabetes  1-Continue bedrest 2-Carb modified diet 3-Nutritional counseling 4-Check CBGs fasting and 2 hr pp.  Kimmberly Wisser H. 06/23/2013,8:12 AM

## 2013-06-23 NOTE — Progress Notes (Signed)
06/23/13 1500  Clinical Encounter Type  Visited With Patient and family together (brother-in-law)  Visit Type Follow-up;Spiritual support;Social support  Spiritual Encounters  Spiritual Needs Emotional   Dana Castillo was in good spirits today, feeling cheered by her brother-in-law's visit and staying upbeat through new gestational diabetes diagnosis.  Per pt, she would love to go home next week and wants to remind providers that she lives five minutes away.  Provided pastoral presence, reflective listening, and encouragement.  [address] [place], [place] [phone]

## 2013-06-24 LAB — GLUCOSE, CAPILLARY
Glucose-Capillary: 86 mg/dL (ref 70–99)
Glucose-Capillary: 95 mg/dL (ref 70–99)

## 2013-06-24 MED ORDER — FAMOTIDINE 20 MG PO TABS
20.0000 mg | ORAL_TABLET | Freq: Two times a day (BID) | ORAL | Status: DC
  Administered 2013-06-24 – 2013-06-27 (×7): 20 mg via ORAL
  Filled 2013-06-24 (×7): qty 1

## 2013-06-24 NOTE — Progress Notes (Signed)
This note also relates to the following rows which could not be included: CBG Lab Component - View only -

## 2013-06-24 NOTE — Progress Notes (Signed)
Report given to [facility] to assume care of pt.

## 2013-06-24 NOTE — Progress Notes (Signed)
This note also relates to the following rows which could not be included: CBG Lab Component - View only - Cannot attach notes to extension rows Assumed care of patient.

## 2013-06-24 NOTE — Progress Notes (Signed)
Patient ID: Dana Castillo, female   DOB: [birth year], 32 y.o.   MRN: [id number] ACULTY PRACTICE ANTEPARTUM COMPREHENSIVE PROGRESS NOTE  Dana Castillo is a 32 y.o. G4P0030 at [redacted]w[redacted]d  who is admitted for Preterm labor.   Fetal presentation is cephalic. Length of Stay:  11  Days  Subjective: Pt denies contractions;  Some increased in discharge  Patient reports good fetal movement.  She reports no uterine contractions, no bleeding and no loss of fluid per vagina.  Vitals:  Blood pressure 122/49, pulse 77, temperature 98.8 F (37.1 C), temperature source Oral, resp. rate 18, height 5\' 4"  (1.626 m), weight 219 lb 14.4 oz (99.746 kg), last menstrual period 11/29/2012. Physical Examination: General appearance - alert, well appearing, and in no distress Abdomen - soft, nontender, nondistended, no masses or organomegaly gravid Extremities - peripheral pulses normal, no pedal edema, no clubbing or cyanosis Cervical Exam: Not evaluated.  Fetal Monitoring:  Baseline: 150 bpm, Variability: Good {> 6 bpm) and Accelerations: Non-reactive but appropriate for gestational age  Labs:  Results for orders placed during the hospital encounter of 06/13/13 (from the past 24 hour(s))  GLUCOSE, CAPILLARY   Collection Time    06/23/13  7:37 AM      Result Value Range   Glucose-Capillary 102 (*) 70 - 99 mg/dL  GLUCOSE, CAPILLARY   Collection Time    06/23/13 10:22 AM      Result Value Range   Glucose-Capillary 91  70 - 99 mg/dL  GLUCOSE, CAPILLARY   Collection Time    06/23/13  2:13 PM      Result Value Range   Glucose-Capillary 100 (*) 70 - 99 mg/dL   Comment 1 Notify RN     Comment 2 Documented in Chart    GLUCOSE, CAPILLARY   Collection Time    06/23/13  9:44 PM      Result Value Range   Glucose-Capillary 90  70 - 99 mg/dL  GLUCOSE, CAPILLARY   Collection Time    06/24/13  5:54 AM      Result Value Range   Glucose-Capillary 86  70 - 99 mg/dL     Medications:  Scheduled . docusate sodium  100 mg  Oral Daily  . famotidine  20 mg Oral BID  . NIFEdipine  30 mg Oral BID  . polyethylene glycol  17 g Oral BID  . prenatal multivitamin  1 tablet Oral Q1200   I have reviewed the patient's current medications.  ASSESSMENT: Patient Active Problem List   Diagnosis Date Noted  . Preterm labor 06/13/2013    PLAN: PTL with advanced cervical dilation GDM well controlled Continue routine antenatal care. Consider discharge at 30 weeks if pt still undelivered and no further cervical dilation   HARRAWAY-SMITH, Brittyn Salaz 06/24/2013,7:35 AM

## 2013-06-25 DIAGNOSIS — O24419 Gestational diabetes mellitus in pregnancy, unspecified control: Secondary | ICD-10-CM | POA: Diagnosis present

## 2013-06-25 DIAGNOSIS — O99810 Abnormal glucose complicating pregnancy: Secondary | ICD-10-CM

## 2013-06-25 LAB — GLUCOSE, CAPILLARY
Glucose-Capillary: 94 mg/dL (ref 70–99)
Glucose-Capillary: 94 mg/dL (ref 70–99)
Glucose-Capillary: 82 mg/dL (ref 70–99)

## 2013-06-25 NOTE — Progress Notes (Signed)
Patient ID: Dana Castillo, female   DOB: [birth year], 32 y.o.   MRN: [id number] ACULTY PRACTICE ANTEPARTUM COMPREHENSIVE PROGRESS NOTE  Dana Castillo is a 32 y.o. G4P0030 at [redacted]w[redacted]d  who is admitted for Preterm labor.   Fetal presentation is cephalic. Length of Stay:  12  Days  Subjective: No complaints Patient reports good fetal movement.  She reports no uterine contractions, no bleeding and no loss of fluid per vagina.  Vitals:  Blood pressure 116/47, pulse 76, temperature 98.9 F (37.2 C), temperature source Oral, resp. rate 18, height 5\' 4"  (1.626 m), weight 219 lb 14.4 oz (99.746 kg), last menstrual period 11/29/2012. Physical Examination: General appearance - alert, well appearing, and in no distress Abdomen - soft, nontender, nondistended, no masses or organomegaly gravid Extremities - peripheral pulses normal, no pedal edema, no clubbing or cyanosis Cervical Exam: Not evaluated.  Fetal Monitoring:  Baseline: 150 bpm, Variability: Moderate {> 6 bpm) and Accelerations: Non-reactive but appropriate for gestational age  Labs:  Results for orders placed during the hospital encounter of 06/13/13 (from the past 24 hour(s))  GLUCOSE, CAPILLARY   Collection Time    06/24/13 10:29 AM      Result Value Range   Glucose-Capillary 84  70 - 99 mg/dL   Comment 1 Notify RN    GLUCOSE, CAPILLARY   Collection Time    06/24/13  3:23 PM      Result Value Range   Glucose-Capillary 95  70 - 99 mg/dL   Comment 1 Notify RN    GLUCOSE, CAPILLARY   Collection Time    06/24/13  8:15 PM      Result Value Range   Glucose-Capillary 112 (*) 70 - 99 mg/dL  GLUCOSE, CAPILLARY   Collection Time    06/25/13  5:53 AM      Result Value Range   Glucose-Capillary 94  70 - 99 mg/dL     Medications:  Scheduled . docusate sodium  100 mg Oral Daily  . famotidine  20 mg Oral BID  . NIFEdipine  30 mg Oral BID  . polyethylene glycol  17 g Oral BID  . prenatal multivitamin  1 tablet Oral Q1200   I have  reviewed the patient's current medications.  ASSESSMENT: Patient Active Problem List   Diagnosis Date Noted  . Gestational diabetes mellitus, antepartum 06/25/2013  . Preterm labor 06/13/2013    PLAN: PTL with advanced cervical dilation A1GDM well controlled Continue routine antenatal care. Consider discharge at 30 weeks if pt still undelivered and no further cervical dilation   Tereso Newcomer, MD 06/25/2013,7:51 AM

## 2013-06-26 LAB — GLUCOSE, CAPILLARY
Glucose-Capillary: 92 mg/dL (ref 70–99)
Glucose-Capillary: 89 mg/dL (ref 70–99)

## 2013-06-26 NOTE — Progress Notes (Signed)
Patient ID: Dana Castillo, female   DOB: 09-28-1980, 32 y.o.   MRN: [id number] FACULTY PRACTICE ANTEPARTUM(COMPREHENSIVE) NOTE  Dana Castillo is a 32 y.o. G4P0030 at [redacted]w[redacted]d  who is admitted for advanced cervical dilation.   Length of Stay:  13  Days  Subjective:  Patient reports the fetal movement as active. Patient reports uterine contraction  activity as none. Patient reports  vaginal bleeding as none. Patient describes fluid per vagina as None.  Vitals:  Blood pressure 110/45, pulse 80, temperature 98.5 F (36.9 C), temperature source Oral, resp. rate 18, height 5\' 4"  (1.626 m), weight 219 lb 14.4 oz (99.746 kg), last menstrual period 11/29/2012. Physical Examination:  General appearance - alert, well appearing, and in no distress Abdomen - soft, nontender, nondistended, no masses or organomegaly Extremities - no edema, redness or tenderness in the calves or thighs, Homan's sign negative bilaterally  Fetal Monitoring:  Baseline: 140 bpm, Variability: Good {> 6 bpm), Accelerations: Reactive and Decelerations: Absent  Labs:  Results for orders placed during the hospital encounter of 06/13/13 (from the past 24 hour(s))  GLUCOSE, CAPILLARY   Collection Time    06/25/13 11:34 AM      Result Value Range   Glucose-Capillary 94  70 - 99 mg/dL  GLUCOSE, CAPILLARY   Collection Time    06/25/13  3:52 PM      Result Value Range   Glucose-Capillary 82  70 - 99 mg/dL  GLUCOSE, CAPILLARY   Collection Time    06/25/13  7:27 PM      Result Value Range   Glucose-Capillary 100 (*) 70 - 99 mg/dL  GLUCOSE, CAPILLARY   Collection Time    06/26/13  7:17 AM      Result Value Range   Glucose-Capillary 92  70 - 99 mg/dL   Comment 1 Documented in Chart     Comment 2 Notify RN      Medications:  Scheduled . docusate sodium  100 mg Oral Daily  . famotidine  20 mg Oral BID  . NIFEdipine  30 mg Oral BID  . polyethylene glycol  17 g Oral BID  . prenatal multivitamin  1 tablet Oral Q1200   I have  reviewed the patient's current medications.  ASSESSMENT: Patient Active Problem List   Diagnosis Date Noted  . Gestational diabetes mellitus, antepartum 06/25/2013  . Preterm labor 06/13/2013    PLAN: Dana Castillo is a 32 y.o. G4P0030 at [redacted]w[redacted]d  who is admitted for advanced cervical dilation.   1-Cervical exam tomorrow 2-GDM under good controlw ith diet.  Pt will need prescriptions for meter, strips, and lancets.   LEGGETT,KELLY H. 06/26/2013,7:28 AM

## 2013-06-26 NOTE — Progress Notes (Signed)
Pt off the monitor after reassurring FHR  

## 2013-06-26 NOTE — Progress Notes (Signed)
Pt sitting up in the bed eating her snack

## 2013-06-26 NOTE — Progress Notes (Signed)
  Nutrition Dx: Food and nutrition-related knowledge deficit r/t no previous education aeb newly diagnosed GDM.   Nutrition education consult for Carbohydrate Modified Gestational Diabetic Diet completed.  "Meal  plan for gestational diabetics" handout given to patient.  Basic concepts reviewed. Pts mother had GDM and father IDDM.  Questions answered.  Patient verbalizes understanding.  Elisabeth Cara M.Odis Luster LDN Neonatal Nutrition Support Specialist Pager [phone]

## 2013-06-27 MED ORDER — DSS 100 MG PO CAPS: 100.0000 mg | ORAL_CAPSULE | Freq: Two times a day (BID) | ORAL | Status: DC

## 2013-06-27 MED ORDER — ACCU-CHEK FASTCLIX LANCETS MISC: 1.0000 [IU] | Freq: Four times a day (QID) | Status: DC

## 2013-06-27 MED ORDER — ACCU-CHEK NANO SMARTVIEW W/DEVICE KIT: 1.0000 | PACK | Freq: Four times a day (QID) | Status: DC

## 2013-06-27 MED ORDER — NIFEDIPINE ER 30 MG PO TB24: 30.0000 mg | ORAL_TABLET | Freq: Two times a day (BID) | ORAL | Status: DC

## 2013-06-27 MED ORDER — GLUCOSE BLOOD VI STRP: ORAL_STRIP | Status: DC

## 2013-06-27 NOTE — Progress Notes (Signed)
Pt. Is stable and ready to be discharged home. All discharge instructions given to the patient. All questions answered. Prescriptions given to the patient. Pt. Has all belongings. Pt. Wheeled out via wheelchair to her husband's car.

## 2013-06-27 NOTE — Discharge Summary (Addendum)
Physician Discharge Summary  Patient ID: Dana Castillo MRN: [id number] DOB/AGE: [birth year] 32 y.o.  Admit date: 06/13/2013 Discharge date: 06/27/2013  Admission Diagnoses: Advanced cervical dilatation with preterm contractions  Discharge Diagnoses:  Principal Problem:   Preterm labor Active Problems:   Gestational diabetes mellitus, antepartum   Discharged Condition: good  Hospital Course: Patient admitted at 27 weeks, transferred from [facility] hospital, secondary to preterm contractions and advanced cervical dilatation. Patient received betamethasone on 11/10 and 11/11 and magnesium sulfate for CP prophylaxis. She was then treated with Procardia for tocolysis. Patient remained stable and without contractions during the remainder of her stay. Repeat cervical exam at 30 weeks was unchanged from admission. Patient found stable for discharge. Discharge instructions provided. Patient plans to follow up at [facility] for the remainder of her care. Patient to continue monitoring CBGs and diabetic diet  Consults: None   Treatments: modified bed rest  Discharge Exam: Blood pressure 123/63, pulse 77, temperature 98.1 F (36.7 C), temperature source Oral, resp. rate 20, height 5\' 4"  (1.626 m), weight 219 lb 14.4 oz (99.746 kg), last menstrual period 11/29/2012. General appearance: alert, cooperative and no distress Resp: clear to auscultation bilaterally Cardio: regular rate and rhythm GI: soft, gravid, NT Pelvic: cervix 5/100/bulging membrane/vertex Extremities: no edema, redness or tenderness in the calves or thighs Baseline 140, moderate variability, pos accels, no decels Toco: irregular contractions  Disposition: Final discharge disposition not confirmed     Medication List         ACCU-CHEK FASTCLIX LANCETS Misc  1 Units by Does not apply route 4 (four) times daily.     ACCU-CHEK NANO SMARTVIEW W/DEVICE Kit  1 Device by Does not apply route 4 (four)  times daily.     DSS 100 MG Caps  Take 100 mg by mouth 2 (two) times daily.     glucose blood test strip  Commonly known as:  ACCU-CHEK SMARTVIEW  Use as instructed     NIFEdipine 30 MG 24 hr tablet  Commonly known as:  PROCARDIA-XL/ADALAT CC  Take 1 tablet (30 mg total) by mouth 2 (two) times daily.     prenatal multivitamin Tabs tablet  Take 1 tablet by mouth daily at 12 noon.           Follow-up Information   Follow up with [facility]. (You will be contacted with an appointment to come in next week. Please contact the clinic by Wednesday if no one has contacted you)    Specialty:  Obstetrics and Gynecology   Contact information:   [address] [place] [address] [phone]      Signed: Zilda No 06/27/2013, 7:15 AM

## 2013-06-28 ENCOUNTER — Encounter (HOSPITAL_COMMUNITY): Payer: Self-pay | Admitting: *Deleted

## 2013-06-28 ENCOUNTER — Inpatient Hospital Stay (HOSPITAL_COMMUNITY): Payer: BC Managed Care – PPO | Admitting: Anesthesiology

## 2013-06-28 ENCOUNTER — Encounter (HOSPITAL_COMMUNITY): Payer: BC Managed Care – PPO | Admitting: Anesthesiology

## 2013-06-28 ENCOUNTER — Inpatient Hospital Stay (HOSPITAL_COMMUNITY)
Admission: AD | Admit: 2013-06-28 | Discharge: 2013-06-30 | DRG: 774 | Disposition: A | Discharge status: Home / Self Care | Payer: BC Managed Care – PPO | Source: Ambulatory Visit | Attending: Obstetrics & Gynecology | Admitting: Obstetrics & Gynecology

## 2013-06-28 DIAGNOSIS — O4590 Premature separation of placenta, unspecified, unspecified trimester: Secondary | ICD-10-CM

## 2013-06-28 DIAGNOSIS — O24419 Gestational diabetes mellitus in pregnancy, unspecified control: Secondary | ICD-10-CM

## 2013-06-28 DIAGNOSIS — O4693 Antepartum hemorrhage, unspecified, third trimester: Secondary | ICD-10-CM

## 2013-06-28 LAB — CBC
Hemoglobin: 12.1 g/dL (ref 12.0–15.0)
MCH: 30.3 pg (ref 26.0–34.0)
Platelets: 351 10*3/uL (ref 150–400)
RBC: 3.99 MIL/uL (ref 3.87–5.11)

## 2013-06-28 LAB — TYPE AND SCREEN
ABO/RH(D): A POS
Antibody Screen: NEGATIVE

## 2013-06-28 LAB — RPR: RPR Ser Ql: NONREACTIVE

## 2013-06-28 MED ORDER — DIPHENHYDRAMINE HCL 50 MG/ML IJ SOLN: 12.5000 mg | INTRAMUSCULAR | Status: DC | PRN

## 2013-06-28 MED ORDER — OXYTOCIN BOLUS FROM INFUSION: 500.0000 mL | INTRAVENOUS | Status: DC

## 2013-06-28 MED ORDER — LACTATED RINGERS IV SOLN: 500.0000 mL | Freq: Once | INTRAVENOUS | Status: DC

## 2013-06-28 MED ORDER — MEASLES, MUMPS & RUBELLA VAC ~~LOC~~ INJ
0.5000 mL | INJECTION | Freq: Once | SUBCUTANEOUS | Status: DC
  Filled 2013-06-28: qty 0.5

## 2013-06-28 MED ORDER — BENZOCAINE-MENTHOL 20-0.5 % EX AERO
1.0000 "application " | INHALATION_SPRAY | CUTANEOUS | Status: DC | PRN
  Filled 2013-06-28: qty 56

## 2013-06-28 MED ORDER — OXYCODONE-ACETAMINOPHEN 5-325 MG PO TABS: 1.0000 | ORAL_TABLET | ORAL | Status: DC | PRN

## 2013-06-28 MED ORDER — PHENYLEPHRINE 40 MCG/ML (10ML) SYRINGE FOR IV PUSH (FOR BLOOD PRESSURE SUPPORT)
80.0000 ug | PREFILLED_SYRINGE | INTRAVENOUS | Status: DC | PRN
  Filled 2013-06-28: qty 2

## 2013-06-28 MED ORDER — PRENATAL MULTIVITAMIN CH
1.0000 | ORAL_TABLET | Freq: Every day | ORAL | Status: DC
  Administered 2013-06-29 – 2013-06-30 (×2): 1 via ORAL
  Filled 2013-06-28 (×2): qty 1

## 2013-06-28 MED ORDER — LACTATED RINGERS IV SOLN: 500.0000 mL | INTRAVENOUS | Status: DC | PRN

## 2013-06-28 MED ORDER — ONDANSETRON HCL 4 MG PO TABS: 4.0000 mg | ORAL_TABLET | ORAL | Status: DC | PRN

## 2013-06-28 MED ORDER — IBUPROFEN 600 MG PO TABS
600.0000 mg | ORAL_TABLET | Freq: Four times a day (QID) | ORAL | Status: DC
  Administered 2013-06-29 – 2013-06-30 (×6): 600 mg via ORAL
  Filled 2013-06-28 (×7): qty 1

## 2013-06-28 MED ORDER — DIPHENHYDRAMINE HCL 25 MG PO CAPS: 25.0000 mg | ORAL_CAPSULE | Freq: Four times a day (QID) | ORAL | Status: DC | PRN

## 2013-06-28 MED ORDER — TETANUS-DIPHTH-ACELL PERTUSSIS 5-2.5-18.5 LF-MCG/0.5 IM SUSP
0.5000 mL | Freq: Once | INTRAMUSCULAR | Status: AC
  Administered 2013-06-29: 0.5 mL via INTRAMUSCULAR
  Filled 2013-06-28: qty 0.5

## 2013-06-28 MED ORDER — ACETAMINOPHEN 325 MG PO TABS: 650.0000 mg | ORAL_TABLET | ORAL | Status: DC | PRN

## 2013-06-28 MED ORDER — FENTANYL 2.5 MCG/ML BUPIVACAINE 1/10 % EPIDURAL INFUSION (WH - ANES)
14.0000 mL/h | INTRAMUSCULAR | Status: DC | PRN
  Filled 2013-06-28: qty 125

## 2013-06-28 MED ORDER — LIDOCAINE HCL (PF) 1 % IJ SOLN
30.0000 mL | INTRAMUSCULAR | Status: DC | PRN
  Filled 2013-06-28 (×2): qty 30

## 2013-06-28 MED ORDER — SIMETHICONE 80 MG PO CHEW: 80.0000 mg | CHEWABLE_TABLET | ORAL | Status: DC | PRN

## 2013-06-28 MED ORDER — CITRIC ACID-SODIUM CITRATE 334-500 MG/5ML PO SOLN: 30.0000 mL | ORAL | Status: DC | PRN

## 2013-06-28 MED ORDER — ONDANSETRON HCL 4 MG/2ML IJ SOLN: 4.0000 mg | INTRAMUSCULAR | Status: DC | PRN

## 2013-06-28 MED ORDER — LANOLIN HYDROUS EX OINT: TOPICAL_OINTMENT | CUTANEOUS | Status: DC | PRN

## 2013-06-28 MED ORDER — DIBUCAINE 1 % RE OINT
1.0000 "application " | TOPICAL_OINTMENT | RECTAL | Status: DC | PRN
  Filled 2013-06-28: qty 28

## 2013-06-28 MED ORDER — LIDOCAINE HCL (PF) 1 % IJ SOLN: 30.0000 mL | INTRAMUSCULAR | Status: DC | PRN

## 2013-06-28 MED ORDER — FENTANYL 2.5 MCG/ML BUPIVACAINE 1/10 % EPIDURAL INFUSION (WH - ANES)
INTRAMUSCULAR | Status: DC | PRN
  Administered 2013-06-28: 14 mL/h via EPIDURAL

## 2013-06-28 MED ORDER — OXYTOCIN 40 UNITS IN LACTATED RINGERS INFUSION - SIMPLE MED
62.5000 mL/h | INTRAVENOUS | Status: DC
  Administered 2013-06-28: 62.5 mL/h via INTRAVENOUS
  Filled 2013-06-28: qty 1000

## 2013-06-28 MED ORDER — FLEET ENEMA 7-19 GM/118ML RE ENEM: 1.0000 | ENEMA | RECTAL | Status: DC | PRN

## 2013-06-28 MED ORDER — SENNOSIDES-DOCUSATE SODIUM 8.6-50 MG PO TABS
2.0000 | ORAL_TABLET | ORAL | Status: DC
  Administered 2013-06-29: 2 via ORAL
  Filled 2013-06-28: qty 2

## 2013-06-28 MED ORDER — LACTATED RINGERS IV SOLN: INTRAVENOUS | Status: DC

## 2013-06-28 MED ORDER — WITCH HAZEL-GLYCERIN EX PADS: 1.0000 "application " | MEDICATED_PAD | CUTANEOUS | Status: DC | PRN

## 2013-06-28 MED ORDER — IBUPROFEN 600 MG PO TABS: 600.0000 mg | ORAL_TABLET | Freq: Four times a day (QID) | ORAL | Status: DC | PRN

## 2013-06-28 MED ORDER — OXYTOCIN 40 UNITS IN LACTATED RINGERS INFUSION - SIMPLE MED: 62.5000 mL/h | INTRAVENOUS | Status: DC

## 2013-06-28 MED ORDER — ZOLPIDEM TARTRATE 5 MG PO TABS: 5.0000 mg | ORAL_TABLET | Freq: Every evening | ORAL | Status: DC | PRN

## 2013-06-28 MED ORDER — PHENYLEPHRINE 40 MCG/ML (10ML) SYRINGE FOR IV PUSH (FOR BLOOD PRESSURE SUPPORT)
80.0000 ug | PREFILLED_SYRINGE | INTRAVENOUS | Status: DC | PRN
  Filled 2013-06-28: qty 10
  Filled 2013-06-28: qty 2

## 2013-06-28 MED ORDER — EPHEDRINE 5 MG/ML INJ
10.0000 mg | INTRAVENOUS | Status: DC | PRN
  Filled 2013-06-28: qty 2

## 2013-06-28 MED ORDER — ONDANSETRON HCL 4 MG/2ML IJ SOLN: 4.0000 mg | Freq: Four times a day (QID) | INTRAMUSCULAR | Status: DC | PRN

## 2013-06-28 MED ORDER — LIDOCAINE HCL (PF) 1 % IJ SOLN
INTRAMUSCULAR | Status: DC | PRN
  Administered 2013-06-28 (×3): 4 mL

## 2013-06-28 MED ORDER — EPHEDRINE 5 MG/ML INJ
10.0000 mg | INTRAVENOUS | Status: DC | PRN
  Filled 2013-06-28: qty 2
  Filled 2013-06-28: qty 4

## 2013-06-28 NOTE — H&P (Signed)
Dana Castillo is a 32 y.o. female G4P0030 with IUP at [redacted]w[redacted]d presenting for labor contractions and vaginal bleeding.   Pt states that 2 hours ago she started having some lower abdominal pain and back pain.  1 hour prior to admission, she went to the bathroom and had a significant amount of bright red blood. She has continued to have brisk bleeding since then.  She is having FM and no LOF.    P[facility]are at [facility] regional since early pregnancy. She was recently admitted over the last 2 weeks for early cervical dilation with an exam of 5/BBOW. She was given BMZ on 11/10 and 11/11.  After her exam remained unchanged, she was discharged yesterday at [redacted] weeks gestation with f/u in outpatient clinic. Since then, she was doing well until this AM.     Prenatal History/Complications:  Past Medical History: Past Medical History  Diagnosis Date  . Anxiety   . PCOS (polycystic ovarian syndrome)   . Cholesteatoma of both ears   . History of mastoidectomy   . Obesity   . Gestational diabetes     Past Surgical History: Past Surgical History  Procedure Laterality Date  . Dilation and curettage of uterus    . Mastoidectomy    . Laparoscopic ovarian      Obstetrical History: OB History   Grav Para Term Preterm Abortions TAB SAB Ect Mult Living   4    3  3          Social History: History   Social History  . Marital Status: Single    Spouse Name: N/A    Number of Children: N/A  . Years of Education: N/A   Social History Main Topics  . Smoking status: Former Smoker -- 0.50 packs/day    Types: Cigarettes  . Smokeless tobacco: None  . Alcohol Use: No  . Drug Use: No  . Sexual Activity: No   Other Topics Concern  . None   Social History Narrative  . None    Family History: History reviewed. No pertinent family history.  Allergies: Allergies  Allergen Reactions  . Codeine Nausea And Vomiting  . Vicodin [Hydrocodone-Acetaminophen] Itching    Prescriptions prior to admission   Medication Sig Dispense Refill  . ACCU-CHEK FASTCLIX LANCETS MISC 1 Units by Does not apply route 4 (four) times daily.  102 each  3  . Blood Glucose Monitoring Suppl (ACCU-CHEK NANO SMARTVIEW) W/DEVICE KIT 1 Device by Does not apply route 4 (four) times daily.  1 kit  0  . docusate sodium 100 MG CAPS Take 100 mg by mouth 2 (two) times daily.  60 capsule  3  . glucose blood (ACCU-CHEK SMARTVIEW) test strip Use as instructed  100 each  12  . NIFEdipine (PROCARDIA-XL/ADALAT CC) 30 MG 24 hr tablet Take 1 tablet (30 mg total) by mouth 2 (two) times daily.  60 tablet  3  . Prenatal Vit-Fe Fumarate-FA (PRENATAL MULTIVITAMIN) TABS tablet Take 1 tablet by mouth daily at 12 noon.         Review of Systems   Constitutional: Negative for fever, chills, weight loss, malaise/fatigue and diaphoresis.  HENT: Negative for hearing loss, ear pain, nosebleeds, congestion, sore throat, neck pain, tinnitus and ear discharge.   Eyes: Negative for blurred vision, double vision, photophobia, pain, discharge and redness.  Respiratory: Negative for cough, hemoptysis, sputum production, shortness of breath, wheezing and stridor.   Cardiovascular: Negative for chest pain, palpitations, orthopnea,  leg swelling  Gastrointestinal: Positive for  abdominal pain. Negative for heartburn, nausea, vomiting, diarrhea, constipation, blood in stool Genitourinary: Negative for dysuria, urgency, frequency, hematuria and flank pain.  Musculoskeletal: Negative for myalgias, back pain, joint pain and falls.  Skin: Negative for itching and rash.  Neurological: Negative for dizziness, tingling, tremors, sensory change, speech change, focal weakness, seizures, loss of consciousness, weakness and headaches.  Endo/Heme/Allergies: Negative for environmental allergies and polydipsia. Does not bruise/bleed easily.  Psychiatric/Behavioral: Negative for depression, suicidal ideas, hallucinations, memory loss and substance abuse. The patient is  not nervous/anxious and does not have insomnia.       Blood pressure 132/71, pulse 94, temperature 98.1 F (36.7 C), temperature source Oral, resp. rate 20, last menstrual period 11/29/2012. General appearance: alert, cooperative and appears uncomfortable Lungs: clear to auscultation bilaterally Heart: regular rate and rhythm Abdomen: soft, non-tender; bowel sounds normal Pelvic: 6.5/100/BBOW SSE: significant amount of clot and blood coming out of the OS and in the vaginal vault.  Extremities: Homans sign is negative, no sign of DVT  Presentation: cephalic- confirmed with US Fetal monitoringBaseline: 140 bpm, Variability: Good {> 6 bpm), Accelerations: Reactive and Decelerations: Absent Uterine activity every 1-16min   Dilation: 6.5 Effacement (%): 90 Station: -1 Exam by:: Dr. Reola Calkins   Prenatal labs: ABO, Rh: --/--/A POS (11/14 1730) Antibody: NEG (11/14 1730) Rubella:   RPR: Nonreactive (07/08 0000)  HBsAg: Negative (07/08 0000)  HIV: Non-reactive (07/08 0000)  GBS: Negative (11/12 0000)  1 hr Glucola 192 Genetic screening  normal Anatomy US normal    Assessment: Dana Castillo is a 32 y.o. G4P0030 with an IUP at [redacted]w[redacted]d presenting for preterm labor and vaginal bleeding. Significant bleeding concerning for placental abruption.    Plan: 1) preterm labor - given bleeding, will not try and tocolyze - admit to L&D - routine orders - pt changed cervix from 6.5 to 7.5 in 1 hour so will get epidural  2) vaginal bleeding - concerning for abruption - cont close monitoring   - pad counts  3) FWB - cat I tracing  - s/p BMZ on 11/10 and 11/11 - NICU notified of admission and likely delivery  4) anticipate SVD.    Vale Haven, MD 06/28/2013, 6:38 PM

## 2013-06-28 NOTE — Anesthesia Procedure Notes (Signed)
Epidural Patient location during procedure: OB Start time: 06/28/2013 7:32 PM  Staffing Anesthesiologist: Cuma Polyakov A. Performed by: anesthesiologist   Preanesthetic Checklist Completed: patient identified, site marked, surgical consent, pre-op evaluation, timeout performed, IV checked, risks and benefits discussed and monitors and equipment checked  Epidural Patient position: sitting Prep: site prepped and draped and DuraPrep Patient monitoring: continuous pulse ox and blood pressure Approach: midline Injection technique: LOR air  Needle:  Needle type: Tuohy  Needle gauge: 17 G Needle length: 9 cm and 9 Needle insertion depth: 7 cm Catheter type: closed end flexible Catheter size: 19 Gauge Catheter at skin depth: 12 cm Test dose: negative and Other  Assessment Events: blood not aspirated, injection not painful, no injection resistance, negative IV test and no paresthesia  Additional Notes Patient identified. Risks and benefits discussed including failed block, incomplete  Pain control, post dural puncture headache, nerve damage, paralysis, blood pressure Changes, nausea, vomiting, reactions to medications-both toxic and allergic and post Partum back pain. All questions were answered. Patient expressed understanding and wished to proceed. Sterile technique was used throughout procedure. Epidural site was Dressed with sterile barrier dressing. No paresthesias, signs of intravascular injection Or signs of intrathecal spread were encountered.  Patient was more comfortable after the epidural was dosed. Please see RN's note for documentation of vital signs and FHR which are stable.

## 2013-06-28 NOTE — H&P (Signed)
Attestation of Attending Supervision of Obstetric Fellow: Evaluation and management procedures were performed by the Obstetric Fellow under my supervision and collaboration.  I have reviewed the Obstetric Fellow's note and chart, and I agree with the management and plan.  Graceyn Fodor, MD, FACOG Attending Obstetrician & Gynecologist Faculty Practice, [facility]   

## 2013-06-28 NOTE — MAU Note (Signed)
Pt was DC'd home from antenatal yesterday, has been 5 cm's, returned today, started having back pain & ? uc's around 1630, went to BR & noticed bleeding @ 1700.  Upon arrival to MAU, regular peri pad approx 3/4 saturated with bright red blood.  Pt denies LOF.

## 2013-06-28 NOTE — Progress Notes (Signed)
Dana Castillo is a 32 y.o. G4P0030 at [redacted]w[redacted]d   Subjective: Comfortable with epidural; very mild rectal pressure occasionally  Objective: BP 124/52  Pulse 75  Temp(Src) 98.1 F (36.7 C) (Oral)  Resp 18  LMP 11/29/2012      FHT:  FHR: 150-160 bpm, variability: moderate,  accelerations:  Present,  decelerations:  Absent UC:   irregular, every 2-5 minutes SVE:   Dilation: Lip/rim Effacement (%): 90 Station: +1 Exam by:: Philipp Deputy, CNM- membranes intact  Labs: Lab Results  Component Value Date   WBC 19.4* 06/28/2013   HGB 12.1 06/28/2013   HCT 35.6* 06/28/2013   MCV 89.2 06/28/2013   PLT 351 06/28/2013    Assessment / Plan: Active labor/transition  Plan to recheck at approx 2200 unless pt with pressure/urge to push before then  Cam Hai 06/28/2013, 9:11 PM

## 2013-06-29 DIAGNOSIS — O4590 Premature separation of placenta, unspecified, unspecified trimester: Secondary | ICD-10-CM

## 2013-06-29 MED ORDER — PNEUMOCOCCAL VAC POLYVALENT 25 MCG/0.5ML IJ INJ
0.5000 mL | INJECTION | INTRAMUSCULAR | Status: AC
  Administered 2013-06-30: 0.5 mL via INTRAMUSCULAR
  Filled 2013-06-29: qty 0.5

## 2013-06-29 NOTE — Progress Notes (Signed)
Post Partum Day 1 s/p preterm SVD at [redacted]w[redacted]d  Subjective: no complaints, up ad lib, voiding, tolerating PO and + flatus. [name] is doing well in NICU.  Breastfeeding.  Undecided about postpartum contraception.  Objective: Blood pressure 95/57, pulse 78, temperature 98.3 F (36.8 C), temperature source Oral, resp. rate 18, last menstrual period 11/29/2012, SpO2 97.00%, unknown if currently breastfeeding.  Physical Exam:  General: alert and no distress Lochia: appropriate Uterine Fundus: firm DVT Evaluation: No evidence of DVT seen on physical exam. Negative Homan's sign.   Recent Labs  06/28/13 1830  HGB 12.1  HCT 35.6*    Assessment/Plan: Breastfeeding, Lactation consult and Contraception Undecided Plan for discharge tomorrow, continue routine postpartum care   LOS: 1 day   Dana Newcomer, MD 06/29/2013, 6:49 AM

## 2013-06-29 NOTE — Anesthesia Postprocedure Evaluation (Signed)
Anesthesia Post Note  Patient: Dana Castillo  Procedure(s) Performed: * No procedures listed *  Anesthesia type: Epidural  Patient location: Mother/Baby  Post pain: Pain level controlled  Post assessment: Post-op Vital signs reviewed  Last Vitals:  Filed Vitals:   06/29/13 0601  BP: 95/57  Pulse: 78  Temp: 36.8 C  Resp: 18    Post vital signs: Reviewed  Level of consciousness: awake  Complications: No apparent anesthesia complications

## 2013-06-30 ENCOUNTER — Encounter (HOSPITAL_COMMUNITY)
Admission: RE | Admit: 2013-06-30 | Discharge: 2013-06-30 | Disposition: A | Discharge status: Home / Self Care | Payer: BC Managed Care – PPO | Source: Ambulatory Visit | Attending: Obstetrics & Gynecology | Admitting: Obstetrics & Gynecology

## 2013-06-30 DIAGNOSIS — O923 Agalactia: Secondary | ICD-10-CM | POA: Insufficient documentation

## 2013-06-30 MED ORDER — IBUPROFEN 600 MG PO TABS: 600.0000 mg | ORAL_TABLET | Freq: Four times a day (QID) | ORAL | Status: DC

## 2013-06-30 NOTE — Discharge Summary (Signed)
Obstetric Discharge Summary Reason for Admission: PTL, abruption at 30+ weeks EGA Prenatal Procedures: procardia for PTL, BMZ x 2 courses Intrapartum Procedures: spontaneous vaginal delivery Postpartum Procedures: none Complications-Operative and Postpartum: none Hemoglobin  Date Value Range Status  06/28/2013 12.1  12.0 - 15.0 g/dL Final     HCT  Date Value Range Status  06/28/2013 35.6* 36.0 - 46.0 % Final    Physical Exam:  General: alert Lochia: appropriate Uterine Fundus: firm Incision: healing well DVT Evaluation: No evidence of DVT seen on physical exam.  Discharge Diagnoses: Premature labor  Discharge Information: Date: 06/30/2013 Activity: pelvic rest Diet: routine Medications: Ibuprofen Condition: stable Instructions: refer to practice specific booklet Discharge to: home CONTRACEPTION: Mirena at 6 week pp visit Feeding: pumping  Newborn Data: Live born female  Birth Weight: 2 lb 11 oz (1219 g) APGAR: 7, 8  Home with NICU on room air nasal canula.  Merie Wulf C. 06/30/2013, 7:14 AM

## 2013-06-30 NOTE — Progress Notes (Signed)
Discharge instructions reviewed with patient.  Patient states understanding of home care, medications, activity, signs/symptoms to report to MD and return MD office visit.  No home equipment needed.  Patient ambulated for discharge in stable condition with staff without incident.  

## 2013-07-05 ENCOUNTER — Ambulatory Visit: Payer: Self-pay

## 2013-07-05 NOTE — Lactation Note (Signed)
This note was copied from the chart of Dana Marzella Miracle. Lactation Consultation Note     Follow up consult with this mom of a NICU baby, now 31 1/7 weeks corrected gestation, and 7 days post partum.  I assisted mom with pumping, and on exam, she has very large breasts, and her bra was very tight. seh c/o an area on the inner side of her right breast that she felt was not emptying, and was hot and hard. I told mom I felt it was from her bra, which was cutting offf drainage at this point. Mom made an appointment for tomorrow with Bonita Quin, to get fitted for a nursing bra. i also increase mom to her 27 flanges, which was a much better fit, and this increased her emptying and amount expressed. I also showed her how to add massage of the knots of milk. She was pleased to see an immediate increase in her pumped amount.  Mom has a hands free bra, which alows her to add massage to pumping. Mom knows to call for questions/concerns, and I will follow her in the NICU.  Patient Name: Dana Castillo [id number] Date: 07/05/2013 Reason for consult: Follow-up assessment;NICU baby   Maternal Data    Feeding    LATCH Score/Interventions                      Lactation Tools Discussed/Used     Consult Status Consult Status: Follow-up Follow-up type:  (prn in NICU)    Alfred Levins 07/05/2013, 4:18 PM

## 2013-07-17 ENCOUNTER — Ambulatory Visit (INDEPENDENT_AMBULATORY_CARE_PROVIDER_SITE_OTHER): Payer: BC Managed Care – PPO | Admitting: Nurse Practitioner

## 2013-07-17 ENCOUNTER — Encounter: Payer: Self-pay | Admitting: Nurse Practitioner

## 2013-07-17 ENCOUNTER — Ambulatory Visit: Payer: Self-pay

## 2013-07-17 VITALS — BP 120/78 | HR 83 | Ht 64.0 in | Wt 215.8 lb

## 2013-07-17 DIAGNOSIS — B379 Candidiasis, unspecified: Secondary | ICD-10-CM | POA: Insufficient documentation

## 2013-07-17 DIAGNOSIS — E669 Obesity, unspecified: Secondary | ICD-10-CM

## 2013-07-17 MED ORDER — FLUCONAZOLE 150 MG PO TABS: 150.0000 mg | ORAL_TABLET | Freq: Every day | ORAL | Status: DC

## 2013-07-17 NOTE — Progress Notes (Signed)
History:  Dana Castillo is a 32 y.o. [phone] who presents to [facility] clinic today for yeast infection on nipples bilaterally. Her daughter was born premature, is in the NICU and the Lactation consultant feels she should be treated to avoid passing to baby. She has gestational diabetes and has not been rechecked since then.   The following portions of the patient's history were reviewed and updated as appropriate: allergies, current medications, past family history, past medical history, past social history, past surgical history and problem list.  Review of Systems:  Pertinent items are noted in HPI.  Objective:  Physical Exam BP 120/78  Pulse 83  Ht 5\' 4"  (1.626 m)  Wt 215 lb 12.8 oz (97.886 kg)  BMI 37.02 kg/m2  Breastfeeding? Yes GENERAL: Well-developed, well-nourished female in no acute distress.  HEENT: Normocephalic, atraumatic.  BREASTS: Symmetric in size. No masses,  nipple drainage, or lymphadenopathy. Shin around the nipples is excoriated. EXTREMITIES: No cyanosis, clubbing, or edema, 2+ distal pulses.   Labs and Imaging No results found.  Assessment & Plan:  Assessment:  A: Yeast infection of nipples bilaterally  Plans:  Diflucan 150 mg po every other day x 3 days Advised to eliminate soda from diet Advised to have glucose tested   Delbert Phenix, NP 07/17/2013 3:44 PM

## 2013-07-17 NOTE — Patient Instructions (Signed)
Yeast Infection of the Skin Some yeast on the skin is normal, but sometimes it causes an infection. If you have a yeast infection, it shows up as white or light brown patches on brown skin. You can see it better in the summer on tan skin. It causes light-colored holes in your suntan. It can happen on any area of the body. This cannot be passed from person to person. HOME CARE  Scrub your skin daily with a dandruff shampoo. Your rash may take a couple weeks to get well.  Do not scratch or itch the rash. GET HELP RIGHT AWAY IF:   You get another infection from scratching. The skin may get warm, red, and may ooze fluid.  The infection does not seem to be getting better. MAKE SURE YOU:  Understand these instructions.  Will watch your condition.  Will get help right away if you are not doing well or get worse. Document Released: 07/02/2008 Document Revised: 10/12/2011 Document Reviewed: 07/02/2008 Exit[employer] Patient Information 2014 [employer].  

## 2013-07-17 NOTE — Lactation Note (Signed)
This note was copied from the chart of Dana Siham Bucaro. Lactation Consultation Note     Follow up consult with this mom of a NICU baby, now 2 weeks ld, and 32 6/[redacted] weeks gestation. Mom has begun nuzzling at times. I informed baby's nurse that the mom may have a yeast infection, and to examine the baby's mouth for possible thrush.     Mom asked me to examine her breast, and to help with pumping. On exam, she may be developing a yeast infection to her nipples. They are pink, with white residue forming on her nipples, that does notwash off. She also described tiny blisters that are painful with pumping. I advised her to det her suction to a comfortable strength.     I increased her to size 30 flanges , with  a much better fit, and better milk expression.  I also showed mom how to hand express in between pumping, to cause additional letdown. Mom was able to express about 80 mls, 20 more than usual.     I advised mom to call her OB and inform them I think she might have a yeast infection, and I would advise the starting of all purpose nipple cream. I also advised mom to decrease her dairy intake, and to start a refrigerated probiotic.      Mom is on fenugreek and mother's milk tea. I advised her to increase her fenugreek to 3 capsules 3 times a day, and gave her information on Moringa, ans advised her to add this also. Mom is pumping about 750 mls a day, which is a good amount. She is pumping about 10 times a day., every 3 hours at night. I advised her to pump every 4 hours at night, to see if getting a bit more sleep helps with increasing her overall supply. I will follow this mom and baby in the nICU.  Patient Name: Dana Castillo RUEAV'W Date: 07/17/2013 Reason for consult: Follow-up assessment;NICU baby   Maternal Data    Feeding Feeding Type: Breast Milk Length of feed: 30 min  LATCH Score/Interventions          Comfort (Breast/Nipple): Filling, red/small blisters or bruises, mild/mod  discomfort  Problem noted: Cracked, bleeding, blisters, bruises;Mild/Moderate discomfort (white /pale yellow crust frming on nipples - does not come of in shower, nipples pink, with tneder, tiny blisters ) Interventions  (Cracked/bleeding/bruising/blister): Expressed breast milk to nipple Interventions (Mild/moderate discomfort): Hand massage;Hand expression (call OB fr all purpose nipple cream, ? yeast infection )        Lactation Tools Discussed/Used Tools: Flanges Flange Size: 30   Consult Status Consult Status: PRN Follow-up type:  (in NICU)    Alfred Levins 07/17/2013, 4:07 PM

## 2013-07-17 NOTE — Progress Notes (Signed)
Thinks she may have a yeast infection around her nipples. Has white that will not wipe off or wash off in shower around the nipple area. Baby is in NICU. She is pumping breast milk.

## 2013-07-26 NOTE — Progress Notes (Signed)
Called pt and informed pt that her short term disability paperwork has been faxed to [employer] and that she can come pick up her paperwork here at the office.  Pt stated understanding and "thank you"

## 2013-07-31 ENCOUNTER — Encounter: Payer: Self-pay | Admitting: Obstetrics & Gynecology

## 2013-07-31 ENCOUNTER — Encounter (HOSPITAL_COMMUNITY)
Admission: RE | Admit: 2013-07-31 | Discharge: 2013-07-31 | Disposition: A | Discharge status: Home / Self Care | Payer: BC Managed Care – PPO | Source: Ambulatory Visit | Attending: Obstetrics & Gynecology | Admitting: Obstetrics & Gynecology

## 2013-07-31 ENCOUNTER — Ambulatory Visit (INDEPENDENT_AMBULATORY_CARE_PROVIDER_SITE_OTHER): Payer: BC Managed Care – PPO | Admitting: Obstetrics & Gynecology

## 2013-07-31 VITALS — BP 128/87 | HR 90 | Temp 98.3°F | Wt 211.4 lb

## 2013-07-31 DIAGNOSIS — O923 Agalactia: Secondary | ICD-10-CM | POA: Insufficient documentation

## 2013-07-31 DIAGNOSIS — Z01818 Encounter for other preprocedural examination: Secondary | ICD-10-CM

## 2013-07-31 DIAGNOSIS — Z30430 Encounter for insertion of intrauterine contraceptive device: Secondary | ICD-10-CM

## 2013-07-31 NOTE — Progress Notes (Signed)
Patient ID: Dana Castillo, female   DOB: August 13, 1980, 32 y.o.   MRN: [id number] Subjective:     Dana Castillo is a 32 y.o. female who presents for a postpartum visit. She is 4 weeks postpartum following a spontaneous vaginal delivery. I have fully reviewed the prenatal and intrapartum course. The delivery was at 30 gestational weeks. Outcome: spontaneous vaginal delivery.  Postpartum course has been uncomplicated. Baby's course: baby is still in the NICU. Baby is feeding by breast. Bleeding no bleeding. Bowel function is normal. Bladder function is normal. Patient is not sexually active. Contraception method is abstinence. Postpartum depression screening: negative.   The following portions of the patient's history were reviewed and updated as appropriate: allergies, current medications, past family history, past medical history, past social history, past surgical history and problem list.  Review of Systems  Pt c/o breast issue but, she is scheduled to see a 'breast specialist/surgeon tomorrow'  Objective:    BP 128/87  Pulse 90  Temp(Src) 98.3 F (36.8 C)  Wt 211 lb 6.4 oz (95.89 kg)  Breastfeeding? Yes  Exam deferred                                       Assessment:    4 weeks postpartum exam. Pap smear not done at today's visit.   Plan:    1. Contraception: none 2. Desires sterilization with bilateral salpingectomy 3. Patient desires surgical management with bilateral salpingectomy for sterilization.  The risks of surgery were discussed in detail with the patient including but not limited to: bleeding which may require transfusion or reoperation; infection which may require prolonged hospitalization or re-hospitalization and antibiotic therapy; injury to bowel, bladder, ureters and major vessels or other surrounding organs; need for additional procedures including laparotomy; thromboembolic phenomenon, incisional problems and other postoperative or anesthesia complications.  Reviewed with patient risk of failure of 3-11/998 and increased risk of ectopic pregnancy if pregnancy does occur. Patient was told that the likelihood that her condition and symptoms will be treated effectively with this surgical management was very high; the postoperative expectations were also discussed in detail. The patient also understands the alternative treatment options which were discussed in full. All questions were answered.  She was told that she will be contacted by our surgical scheduler regarding the time and date of her surgery; routine preoperative instructions of having nothing to eat or drink after midnight on the day prior to surgery and also coming to the hospital 1 1/2 hours prior to her time of surgery were also emphasized.  She was told she may be called for a preoperative appointment about a week prior to surgery and will be given further preoperative instructions at that visit. Printed patient education handouts about the procedure were given to the patient to review at home.

## 2013-07-31 NOTE — Patient Instructions (Signed)
Salpingectomy Salpingectomy, also called tubectomy, is the removal of one of the fallopian tubes with surgery. The fallopian tubes are the tubes that are connected to the uterus. These tubes transport the egg from the ovary to the womb (uterus). Removing one tube does not prevent you from becoming pregnant. Salpingectomy does not have any adverse affects on your menstrual periods. There are many reasons to have a salpingectomy, such as if:  You have a tubal (ectopic) pregnancy. This is especially true if the tube ruptures.  You have an infected tube.  It is necessary to remove the tube when removing an ovary with a cyst or tumor.  The uterus needs to be removed.  You need surgery for cancer of the tube or other female organs. LET YOUR CAREGIVER KNOW ABOUT:  Allergies to foods or medications.  All the medications you are taking. This includes over-the-counter and prescription drugs, herbs, eye drops, creams and steroids.  If you are using illegal drugs or drinking alcohol.  Your smoking habits.  Previous problems with anesthesia including numbing medication.  The possibility of being pregnant.  History of blood clotting or other blood problems.  Previous surgery.  Other medical or health problems. RISKS AND COMPLICATIONS   Injury to surrounding organs.  Bleeding.  Infection.  The surgery does not correct the problem.  You have problems with the anesthesia. BEFORE THE PROCEDURE  Do not take aspirin or blood thinners. They can cause bleeding.  Do not eat or drink anything at least 8 hours before the surgery.  Let your caregiver know if you develop a cold or an infection.  If you are being admitted the day of surgery, arrive at least one hour before the surgery to read and sign the necessary forms and consents.  Arrange for help when you go home from the hospital.  If you smoke, do not smoke for at least 2 weeks before the surgery. PROCEDURE  You will be given an  IV (intravenous) and a medication to relax you. Then, you will be put to sleep with a drug (anesthetic). Any hair on your lower belly (abdomen) will be removed and a catheter will be placed in your bladder. Through 2 very small cuts (incisions) if you have a laparoscopy, or a large incision in the lower abdomen, the fallopian tube will be removed from where it attaches to the uterus. The blood vessels will be clamped and tied.  AFTER THE PROCEDURE   You will be taken to the recovery room and observed for 1 to 3 hours.  If you had a laparoscopy, you may be discharged after several hours.  If you had a large incision, you will be admitted to the hospital for a couple of days.  If you had a laparoscopy, you may have shoulder pain. This is not unusual and is from some air that is left in the abdomen. This air affects the nerve that goes from the diaphragm to the shoulder. It goes away in a day or two.  You will be given pain medication if needed.  The intravenous and catheter will be removed before you are discharged.  Have someone available to take you home. Document Released: 12/06/2008 Document Revised: 10/12/2011 Document Reviewed: 01/11/2013 [facility] Patient Information 2014 [place], [place]. Sterilization Information, Female Female sterilization is a procedure to permanently prevent pregnancy. There are different ways to perform sterilization, but all either block or close the fallopian tubes so that your eggs cannot reach your uterus. If your egg cannot reach  your uterus, sperm cannot fertilize the egg, and you cannot get pregnant.  Sterilization is performed by a surgical procedure. Sometimes these procedures are performed in a hospital while a patient is asleep. Sometimes they can be done in a clinic setting with the patient awake. The fallopian tubes can be surgically cut, tied, or sealed through a procedure called tubal ligation. The fallopian tubes can also be closed with clips or rings.  Sterilization can also be done by placing a tiny coil into each fallopian tube, which causes scar tissue to grow inside the tube. The scar tissue then blocks the tubes.  Discuss sterilization with your caregiver to answer any concerns you or your partner may have. You may want to ask what type of sterilization your caregiver performs. Some caregivers may not perform all the various options. Sterilization is permanent and should only be done if you are sure you do not want children or do not want any more children. Having a sterilization reversed may not be successful.  STERILIZATION PROCEDURES  Laparoscopic sterilization. This is a surgical method performed at a time other than right after childbirth. Two incisions are made in the lower abdomen. A thin, lighted tube (laparoscope) is inserted into one of the incisions and is used to perform the procedure. The fallopian tubes are closed with a ring or a clip. An instrument that uses heat could be used to seal the tubes closed (electrocautery).   Mini-laparotomy. This is a surgical method done 1 or 2 days after giving birth. Typically, a small incision is made just below the belly button (umbilicus) and the fallopian tubes are exposed. The tubes can then be sealed, tied, or cut.   Hysteroscopic sterilization. This is performed at a time other than right after childbirth. A tiny, spring-like coil is inserted through the cervix and uterus and placed into the fallopian tubes. The coil causes scaring and blocks the tubes. Other forms of contraception should be used for 3 months after the procedure to allow the scar tissue to form completely. Additionally, it is required hysterosalpingography be done 3 months later to ensure that the procedure was successful. Hysterosalpingography is a procedure that uses X-rays to look at your uterus and fallopian tubes after a material to make them show up better has been inserted. IS STERILIZATION SAFE? Sterilization is  considered safe with very rare complications. Risks depend on the type of procedure you have. As with any surgical procedure, there are risks. Some risks of sterilization by any means include:   Bleeding.  Infection.  Reaction to anesthesia medicine.  Injury to surrounding organs. Risks specific to having hysteroscopic coils placed include:  The coils may not be placed correctly the first time.   The coils may move out of place.   The tubes may not get completely blocked after 3 months.   Injury to surrounding organs when placing the coil.  HOW EFFECTIVE IS FEMALE STERILIZATION? Sterilization is nearly 100% effective, but it can fail. Depending on the type of sterilization, the rate of failure can be as high as 3%. After hysteroscopic sterilization with placement of fallopian tube coils, you will need back-up birth control for 3 months after the procedure. Sterilization is effective for a lifetime.  BENEFITS OF STERILIZATION  It does not affect your hormones, and therefore will not affect your menstrual periods, sexual desire, or performance.   It is effective for a lifetime.   It is safe.   You do not need to worry about getting pregnant.  Keep in mind that if you had the hysteroscopic placement procedure, you must wait 3 months after the procedure (or until your caregiver confirms) before pregnancy is not considered possible.   There are no side effects unlike other types of birth control (contraception).  DRAWBACKS OF STERILIZATION  You must be sure you do not want children or any more children. The procedure is permanent.   It does not provide protection against sexually transmitted infections (STIs).   The tubes can grow back together. If this happens, there is a risk of pregnancy. There is also an increased risk (50%) of pregnancy being an ectopic pregnancy. This is a pregnancy that happens outside of the uterus. Document Released: 01/06/2008 Document Revised:  01/19/2012 Document Reviewed: 11/05/2011 [facility] Patient Information 2014 [place], [place].

## 2013-08-07 ENCOUNTER — Encounter: Payer: Self-pay | Admitting: Obstetrics & Gynecology

## 2013-08-07 ENCOUNTER — Encounter: Payer: Self-pay | Admitting: *Deleted

## 2013-08-09 ENCOUNTER — Encounter (HOSPITAL_COMMUNITY): Payer: Self-pay | Admitting: Pharmacist

## 2013-08-16 ENCOUNTER — Encounter (HOSPITAL_COMMUNITY)
Admission: RE | Admit: 2013-08-16 | Discharge: 2013-08-16 | Disposition: A | Discharge status: Home / Self Care | Payer: BC Managed Care – PPO | Source: Ambulatory Visit | Attending: Obstetrics & Gynecology | Admitting: Obstetrics & Gynecology

## 2013-08-22 ENCOUNTER — Encounter (HOSPITAL_COMMUNITY): Payer: Self-pay

## 2013-08-31 ENCOUNTER — Encounter (HOSPITAL_COMMUNITY)
Admission: RE | Admit: 2013-08-31 | Discharge: 2013-08-31 | Disposition: A | Discharge status: Home / Self Care | Payer: BC Managed Care – PPO | Source: Ambulatory Visit | Attending: Obstetrics & Gynecology | Admitting: Obstetrics & Gynecology

## 2013-08-31 DIAGNOSIS — O923 Agalactia: Secondary | ICD-10-CM | POA: Insufficient documentation

## 2013-09-06 ENCOUNTER — Encounter (HOSPITAL_COMMUNITY): Payer: Self-pay | Admitting: *Deleted

## 2013-09-06 ENCOUNTER — Encounter (HOSPITAL_COMMUNITY)
Admission: RE | Disposition: A | Discharge status: Home / Self Care | Payer: Self-pay | Source: Ambulatory Visit | Attending: Obstetrics & Gynecology

## 2013-09-06 ENCOUNTER — Encounter (HOSPITAL_COMMUNITY): Payer: BC Managed Care – PPO | Admitting: Anesthesiology

## 2013-09-06 ENCOUNTER — Ambulatory Visit (HOSPITAL_COMMUNITY)
Admission: RE | Admit: 2013-09-06 | Discharge: 2013-09-06 | Disposition: A | Discharge status: Home / Self Care | Payer: BC Managed Care – PPO | Source: Ambulatory Visit | Attending: Obstetrics & Gynecology | Admitting: Obstetrics & Gynecology

## 2013-09-06 ENCOUNTER — Ambulatory Visit (HOSPITAL_COMMUNITY): Payer: BC Managed Care – PPO | Admitting: Anesthesiology

## 2013-09-06 DIAGNOSIS — N838 Other noninflammatory disorders of ovary, fallopian tube and broad ligament: Secondary | ICD-10-CM | POA: Insufficient documentation

## 2013-09-06 DIAGNOSIS — N831 Corpus luteum cyst of ovary, unspecified side: Secondary | ICD-10-CM | POA: Insufficient documentation

## 2013-09-06 DIAGNOSIS — F17200 Nicotine dependence, unspecified, uncomplicated: Secondary | ICD-10-CM | POA: Insufficient documentation

## 2013-09-06 DIAGNOSIS — N9489 Other specified conditions associated with female genital organs and menstrual cycle: Secondary | ICD-10-CM | POA: Insufficient documentation

## 2013-09-06 DIAGNOSIS — N736 Female pelvic peritoneal adhesions (postinfective): Secondary | ICD-10-CM

## 2013-09-06 DIAGNOSIS — Z302 Encounter for sterilization: Secondary | ICD-10-CM | POA: Insufficient documentation

## 2013-09-06 HISTORY — PX: EAR CYST EXCISION: SHX22

## 2013-09-06 HISTORY — PX: LAPAROSCOPIC BILATERAL SALPINGECTOMY: SHX5889

## 2013-09-06 HISTORY — PX: LAPAROSCOPIC LYSIS OF ADHESIONS: SHX5905

## 2013-09-06 LAB — CBC
HCT: 44.3 % (ref 36.0–46.0)
Hemoglobin: 14.3 g/dL (ref 12.0–15.0)
MCH: 29 pg (ref 26.0–34.0)
MCHC: 32.3 g/dL (ref 30.0–36.0)
MCV: 89.9 fL (ref 78.0–100.0)
PLATELETS: 332 10*3/uL (ref 150–400)
RBC: 4.93 MIL/uL (ref 3.87–5.11)
RDW: 14.4 % (ref 11.5–15.5)
WBC: 15.9 10*3/uL — AB (ref 4.0–10.5)

## 2013-09-06 LAB — PREGNANCY, URINE: PREG TEST UR: NEGATIVE

## 2013-09-06 SURGERY — SALPINGECTOMY, BILATERAL, LAPAROSCOPIC
Anesthesia: General | Site: Abdomen | Laterality: Right

## 2013-09-06 MED ORDER — DEXAMETHASONE SODIUM PHOSPHATE 10 MG/ML IJ SOLN
INTRAMUSCULAR | Status: AC
  Filled 2013-09-06: qty 1

## 2013-09-06 MED ORDER — MIDAZOLAM HCL 2 MG/2ML IJ SOLN
INTRAMUSCULAR | Status: AC
  Filled 2013-09-06: qty 2

## 2013-09-06 MED ORDER — GLYCOPYRROLATE 0.2 MG/ML IJ SOLN
INTRAMUSCULAR | Status: AC
  Filled 2013-09-06: qty 3

## 2013-09-06 MED ORDER — ONDANSETRON HCL 4 MG/2ML IJ SOLN
INTRAMUSCULAR | Status: DC | PRN
  Administered 2013-09-06: 4 mg via INTRAVENOUS

## 2013-09-06 MED ORDER — FENTANYL CITRATE 0.05 MG/ML IJ SOLN
INTRAMUSCULAR | Status: AC
Start: 2013-09-06 — End: 2013-09-06
  Filled 2013-09-06: qty 5

## 2013-09-06 MED ORDER — GLYCOPYRROLATE 0.2 MG/ML IJ SOLN
INTRAMUSCULAR | Status: DC | PRN
  Administered 2013-09-06: .6 mg via INTRAVENOUS

## 2013-09-06 MED ORDER — LIDOCAINE HCL (CARDIAC) 20 MG/ML IV SOLN
INTRAVENOUS | Status: AC
  Filled 2013-09-06: qty 5

## 2013-09-06 MED ORDER — NEOSTIGMINE METHYLSULFATE 1 MG/ML IJ SOLN
INTRAMUSCULAR | Status: AC
  Filled 2013-09-06: qty 1

## 2013-09-06 MED ORDER — PROMETHAZINE HCL 25 MG/ML IJ SOLN
INTRAMUSCULAR | Status: AC
  Administered 2013-09-06: 12.5 mg via INTRAVENOUS
  Filled 2013-09-06: qty 1

## 2013-09-06 MED ORDER — FENTANYL CITRATE 0.05 MG/ML IJ SOLN
INTRAMUSCULAR | Status: DC | PRN
  Administered 2013-09-06: 50 ug via INTRAVENOUS
  Administered 2013-09-06: 100 ug via INTRAVENOUS
  Administered 2013-09-06 (×4): 50 ug via INTRAVENOUS

## 2013-09-06 MED ORDER — KETOROLAC TROMETHAMINE 30 MG/ML IJ SOLN
INTRAMUSCULAR | Status: AC
  Filled 2013-09-06: qty 1

## 2013-09-06 MED ORDER — ONDANSETRON HCL 4 MG/2ML IJ SOLN
INTRAMUSCULAR | Status: AC
  Filled 2013-09-06: qty 2

## 2013-09-06 MED ORDER — OXYCODONE-ACETAMINOPHEN 5-325 MG PO TABS: 1.0000 | ORAL_TABLET | Freq: Four times a day (QID) | ORAL | Status: DC | PRN

## 2013-09-06 MED ORDER — FENTANYL CITRATE 0.05 MG/ML IJ SOLN
25.0000 ug | INTRAMUSCULAR | Status: DC | PRN
  Administered 2013-09-06: 50 ug via INTRAVENOUS

## 2013-09-06 MED ORDER — LACTATED RINGERS IR SOLN
Status: DC | PRN
  Administered 2013-09-06: 3000 mL

## 2013-09-06 MED ORDER — BUPIVACAINE HCL (PF) 0.25 % IJ SOLN
INTRAMUSCULAR | Status: DC | PRN
  Administered 2013-09-06: 20 mL

## 2013-09-06 MED ORDER — PROPOFOL 10 MG/ML IV BOLUS
INTRAVENOUS | Status: DC | PRN
  Administered 2013-09-06: 200 mg via INTRAVENOUS

## 2013-09-06 MED ORDER — DEXAMETHASONE SODIUM PHOSPHATE 10 MG/ML IJ SOLN
INTRAMUSCULAR | Status: DC | PRN
  Administered 2013-09-06: 10 mg via INTRAVENOUS

## 2013-09-06 MED ORDER — PROMETHAZINE HCL 25 MG/ML IJ SOLN
6.2500 mg | INTRAMUSCULAR | Status: DC | PRN
  Administered 2013-09-06: 12.5 mg via INTRAVENOUS

## 2013-09-06 MED ORDER — MEPERIDINE HCL 25 MG/ML IJ SOLN: 6.2500 mg | INTRAMUSCULAR | Status: DC | PRN

## 2013-09-06 MED ORDER — ROCURONIUM BROMIDE 100 MG/10ML IV SOLN
INTRAVENOUS | Status: AC
  Filled 2013-09-06: qty 1

## 2013-09-06 MED ORDER — PROPOFOL 10 MG/ML IV EMUL
INTRAVENOUS | Status: AC
  Filled 2013-09-06: qty 20

## 2013-09-06 MED ORDER — SODIUM CHLORIDE 0.9 % IJ SOLN
INTRAMUSCULAR | Status: AC
  Filled 2013-09-06: qty 10

## 2013-09-06 MED ORDER — MIDAZOLAM HCL 2 MG/2ML IJ SOLN
INTRAMUSCULAR | Status: DC | PRN
  Administered 2013-09-06: 2 mg via INTRAVENOUS

## 2013-09-06 MED ORDER — NEOSTIGMINE METHYLSULFATE 1 MG/ML IJ SOLN
INTRAMUSCULAR | Status: DC | PRN
  Administered 2013-09-06: 3 mg via INTRAVENOUS

## 2013-09-06 MED ORDER — BUPIVACAINE HCL (PF) 0.25 % IJ SOLN
INTRAMUSCULAR | Status: AC
  Filled 2013-09-06: qty 30

## 2013-09-06 MED ORDER — IBUPROFEN 600 MG PO TABS: 600.0000 mg | ORAL_TABLET | Freq: Four times a day (QID) | ORAL | Status: DC | PRN

## 2013-09-06 MED ORDER — FENTANYL CITRATE 0.05 MG/ML IJ SOLN
INTRAMUSCULAR | Status: AC
  Filled 2013-09-06: qty 2

## 2013-09-06 MED ORDER — MIDAZOLAM HCL 2 MG/2ML IJ SOLN: 0.5000 mg | Freq: Once | INTRAMUSCULAR | Status: DC | PRN

## 2013-09-06 MED ORDER — KETOROLAC TROMETHAMINE 30 MG/ML IJ SOLN
INTRAMUSCULAR | Status: DC | PRN
  Administered 2013-09-06: 30 mg via INTRAVENOUS

## 2013-09-06 MED ORDER — KETOROLAC TROMETHAMINE 30 MG/ML IJ SOLN: 15.0000 mg | Freq: Once | INTRAMUSCULAR | Status: DC | PRN

## 2013-09-06 MED ORDER — LACTATED RINGERS IV SOLN: INTRAVENOUS | Status: DC

## 2013-09-06 MED ORDER — LIDOCAINE HCL (CARDIAC) 20 MG/ML IV SOLN
INTRAVENOUS | Status: DC | PRN
  Administered 2013-09-06: 80 mg via INTRAVENOUS

## 2013-09-06 MED ORDER — ROCURONIUM BROMIDE 100 MG/10ML IV SOLN
INTRAVENOUS | Status: DC | PRN
  Administered 2013-09-06: 30 mg via INTRAVENOUS
  Administered 2013-09-06: 10 mg via INTRAVENOUS

## 2013-09-06 MED ORDER — SODIUM CHLORIDE 0.9 % IJ SOLN
INTRAMUSCULAR | Status: DC | PRN
  Administered 2013-09-06: 3 mL

## 2013-09-06 MED ORDER — FENTANYL CITRATE 0.05 MG/ML IJ SOLN
INTRAMUSCULAR | Status: AC
  Administered 2013-09-06: 50 ug via INTRAVENOUS
  Filled 2013-09-06: qty 2

## 2013-09-06 MED ORDER — SCOPOLAMINE 1 MG/3DAYS TD PT72
MEDICATED_PATCH | TRANSDERMAL | Status: AC
  Filled 2013-09-06: qty 1

## 2013-09-06 MED ORDER — LACTATED RINGERS IV SOLN
INTRAVENOUS | Status: DC
  Administered 2013-09-06 (×2): via INTRAVENOUS

## 2013-09-06 SURGICAL SUPPLY — 31 items
CABLE HIGH FREQUENCY MONO STRZ (ELECTRODE) IMPLANT
CATH ROBINSON RED A/P 16FR (CATHETERS) IMPLANT
CHLORAPREP W/TINT 26ML (MISCELLANEOUS) ×4 IMPLANT
CLOTH BEACON ORANGE TIMEOUT ST (SAFETY) ×4 IMPLANT
DERMABOND ADVANCED (GAUZE/BANDAGES/DRESSINGS) ×1
DERMABOND ADVANCED .7 DNX12 (GAUZE/BANDAGES/DRESSINGS) ×3 IMPLANT
GLOVE BIO SURGEON STRL SZ7 (GLOVE) ×8 IMPLANT
GLOVE BIOGEL PI IND STRL 7.0 (GLOVE) ×3 IMPLANT
GLOVE BIOGEL PI INDICATOR 7.0 (GLOVE) ×1
GOWN STRL REUS W/ TWL XL LVL3 (GOWN DISPOSABLE) ×3 IMPLANT
GOWN STRL REUS W/TWL LRG LVL3 (GOWN DISPOSABLE) ×4 IMPLANT
GOWN STRL REUS W/TWL XL LVL3 (GOWN DISPOSABLE) ×1
MANIPULATOR UTERINE 4.5 ZUMI (MISCELLANEOUS) ×4 IMPLANT
NEEDLE INSUFFLATION 120MM (ENDOMECHANICALS) ×4 IMPLANT
NS IRRIG 1000ML POUR BTL (IV SOLUTION) ×4 IMPLANT
PACK LAPAROSCOPY BASIN (CUSTOM PROCEDURE TRAY) ×4 IMPLANT
POUCH SPECIMEN RETRIEVAL 10MM (ENDOMECHANICALS) ×4 IMPLANT
PROTECTOR NERVE ULNAR (MISCELLANEOUS) ×4 IMPLANT
SCALPEL HARMONIC ACE (MISCELLANEOUS) ×4 IMPLANT
SET IRRIG TUBING LAPAROSCOPIC (IRRIGATION / IRRIGATOR) ×4 IMPLANT
SUT VICRYL 0 UR6 27IN ABS (SUTURE) IMPLANT
SUT VICRYL 4-0 PS2 18IN ABS (SUTURE) ×4 IMPLANT
SYR 5ML LL (SYRINGE) ×4 IMPLANT
TOWEL OR 17X24 6PK STRL BLUE (TOWEL DISPOSABLE) ×8 IMPLANT
TRAY FOLEY BAG SILVER LF 14FR (CATHETERS) ×4 IMPLANT
TRAY FOLEY CATH 14FR (SET/KITS/TRAYS/PACK) IMPLANT
TROCAR BALLN 12MMX100 BLUNT (TROCAR) IMPLANT
TROCAR OPTI TIP 5M 100M (ENDOMECHANICALS) ×4 IMPLANT
TROCAR XCEL DIL TIP R 11M (ENDOMECHANICALS) ×4 IMPLANT
TROCAR XCEL OPT SLVE 5M 100M (ENDOMECHANICALS) IMPLANT
WARMER LAPAROSCOPE (MISCELLANEOUS) ×4 IMPLANT

## 2013-09-06 NOTE — Brief Op Note (Signed)
09/06/2013  1:56 PM  PATIENT:  Dana Castillo  33 y.o. female  PRE-OPERATIVE DIAGNOSIS:   Undesired fertility  POST-OPERATIVE DIAGNOSIS:   Undesired fertility  PROCEDURE:  Procedure(s): LAPAROSCOPIC BILATERAL SALPINGECTOMY (Bilateral) Lysis of adhesions; resection of left adnexal cyst  SURGEON:  Surgeon(s) and Role:    * Lavonia Drafts, MD - Primary       * Mora Bellman, MD - Assisting   ANESTHESIA:   general  EBL:  Total I/O In: 1000 [I.V.:1000] Out: -   BLOOD ADMINISTERED:none  DRAINS: none   LOCAL MEDICATIONS USED:  MARCAINE     SPECIMEN:  Source of Specimen:  bilateral fallopian tubes; cyst from left adnexa  DISPOSITION OF SPECIMEN:  PATHOLOGY  COUNTS:  YES  TOURNIQUET:  * No tourniquets in log *  DICTATION: .Note written in EPIC  PLAN OF CARE: Discharge to home after PACU  PATIENT DISPOSITION:  PACU - hemodynamically stable.   Delay start of Pharmacological VTE agent (>24hrs) due to surgical blood loss or risk of bleeding: not applicable

## 2013-09-06 NOTE — OR Nursing (Signed)
Added a latex free foley to the supplies

## 2013-09-06 NOTE — Op Note (Signed)
1:56 PM  PATIENT: Dana Castillo 33 y.o. female  PRE-OPERATIVE DIAGNOSIS: Undesired fertility  POST-OPERATIVE DIAGNOSIS: Undesired fertility  PROCEDURE: Procedure(s):  LAPAROSCOPIC BILATERAL SALPINGECTOMY (Bilateral)  Lysis of adhesions; resection of left adnexal cyst  SURGEON: Surgeon(s) and Role:  * Lavonia Drafts, MD - Primary  * Mora Bellman, MD - Assisting  ANESTHESIA: general  EBL: Total I/O  In: 1000 [I.V.:1000]  Out: -  BLOOD ADMINISTERED:none  DRAINS: none  LOCAL MEDICATIONS USED: MARCAINE  SPECIMEN: Source of Specimen: bilateral fallopian tubes; cyst from left adnexa  DISPOSITION OF SPECIMEN: PATHOLOGY  COUNTS: YES  TOURNIQUET: * No tourniquets in log *  DICTATION: .Note written in EPIC  PLAN OF CARE: Discharge to home after PACU  PATIENT DISPOSITION: PACU - hemodynamically stable.  Delay start of Pharmacological VTE agent (>24hrs) due to surgical blood loss or risk of bleeding: not applicable     IINDICATIONS: 33 y.o. [id number] with undesired fertility, desires permanent sterilization. Other reversible forms of contraception were discussed with patient; she declines all other modalities.  Risks of procedure discussed with patient including permanence of method, risk of regret, bleeding, infection, injury to surrounding organs and need for additional procedures including laparotomy.  Failure risk less than 0.5% with increased risk of ectopic gestation if pregnancy occurs was also discussed with patient.  Written informed consent was obtained.    FINDINGS:  Normal uterus, fallopian tube and ovary on right were normal.  There was an hemorrhagia adnexal mass on the left side encased in adhesions.  Ovary on left side poorly visualized.  Possibly adherent to the uterus.  TECHNIQUE:  The patient was taken to the operating room where general anesthesia was obtained without difficulty.  She was then placed in the dorsal lithotomy position and prepared and draped in sterile  fashion.  After an adequate timeout was performed, a bivalved speculum was then placed in the patient's vagina, and the anterior lip of cervix grasped with the single-tooth tenaculum.  The uterine manipulator was then advanced into the uterus.  The speculum was removed from the vagina.  Attention was then turned to the patient's abdomen where a 5-mm skin incision was made in the umbilical fold.  A 5-mm trocar and sleeve were then advanced without difficulty with the laparoscope under direct visualization into the abdomen.  The abdomen was then insufflated with carbon dioxide gas.  Adequate pneumoperitoneum was obtained.  A survey of the patient's pelvis and abdomen revealed the findings above.  Bilateral 5-mm lower quadrant ports  were then placed under direct visualization.  The fallopian tube on the right was transected from the uterine attachments and the underlying mesosalpinx with the Harmonic scalpel allowing for right salpingectomy, attention was then turned to the left side which was found to have adhesion and a mass that appeared to be in the mesosalpinx but, could also have been blood and a cyst contained within scar tissue.  The bloody portion was drained and a clear walled cyst was removed intact and sent to pathology.  There were adnexal adhesion on this side that were released and the fallopian tube was freed up.  The mesosalpinx was grasped with the Harmonic scalpel allowing for a salpingectomy. The right lower quadrant port was converted into an 65mm post and an Endocatch bag was used to remove the fallopian ties and the cyst.  The operative site was surveyed, and it was found to be hemostatic.   No intraoperative injury to other surrounding organs was noted.  The abdomen was desufflated  and all instruments were then removed from the patient's abdomen. 20cc of  .25% Marcaine was injected into the ports sites. All skin incisions were closed with Dermabond.  The uterine manipulator was removed from  the vagina without complications. The patient tolerated the procedure well.  Sponge, lap, and needle counts were correct times two.  The patient was then taken to the recovery room awake, extubated and in stable condition.  The patient will be discharged to home as per PACU criteria.  Routine postoperative instructions given.  She was prescribed Percocet and Ibuprofen.  She will follow up in the clinic in 2 weeks for postoperative evaluation.

## 2013-09-06 NOTE — Preoperative (Signed)
Beta Blockers   Reason not to administer Beta Blockers:Not Applicable 

## 2013-09-06 NOTE — H&P (Signed)
Dana Castillo is a 33 y.o. female [phone]  Who presents for bilateral salpingectomy for sterilization. No complaints.    Past Medical History:  Past Medical History   Diagnosis  Date   .  Anxiety    .  PCOS (polycystic ovarian syndrome)    .  Cholesteatoma of both ears    .  History of mastoidectomy    .  Obesity    .  Gestational diabetes    Past Surgical History:  Past Surgical History   Procedure  Laterality  Date   .  Dilation and curettage of uterus     .  Mastoidectomy     .  Laparoscopic ovarian      Social History:  History    Social History   .  Marital Status:  Single     Spouse Name:  N/A     Number of Children:  N/A   .  Years of Education:  N/A    Social History Main Topics   .  Smoking status:  Former Smoker -- 0.50 packs/day     Types:  Cigarettes   .  Smokeless tobacco:  None   .  Alcohol Use:  No   .  Drug Use:  No   .  Sexual Activity:  No    Other Topics  Concern   .  None    Social History Narrative   .  None   Family History:  History reviewed. No pertinent family history.  Allergies:  Allergies   Allergen  Reactions   .  Codeine  Nausea And Vomiting   .  Vicodin [Hydrocodone-Acetaminophen]  Itching    Prescriptions prior to admission   Medication  Sig  Dispense  Refill   .  ACCU-CHEK FASTCLIX LANCETS MISC  1 Units by Does not apply route 4 (four) times daily.  102 each  3   .  Blood Glucose Monitoring Suppl (ACCU-CHEK NANO SMARTVIEW) W/DEVICE KIT  1 Device by Does not apply route 4 (four) times daily.  1 kit  0   .  docusate sodium 100 MG CAPS  Take 100 mg by mouth 2 (two) times daily.  60 capsule  3   .  glucose blood (ACCU-CHEK SMARTVIEW) test strip  Use as instructed  100 each  12   .  NIFEdipine (PROCARDIA-XL/ADALAT CC) 30 MG 24 hr tablet  Take 1 tablet (30 mg total) by mouth 2 (two) times daily.  60 tablet  3   .  Prenatal Vit-Fe Fumarate-FA (PRENATAL MULTIVITAMIN) TABS tablet  Take 1 tablet by mouth daily at 12 noon.     Review of  Systems BP 132/73  Pulse 77  Temp(Src) 98.1 F (36.7 C) (Oral)  Resp 18  Ht _0  (1.626 m)  Wt 211 lb (95.709 kg)  BMI 36.20 kg/m2  SpO2 99%  LMP 11/29/2012  Constitutional: Negative for fever, chills, weight loss, malaise/fatigue and diaphoresis.  HENT: Negative for hearing loss, ear pain, nosebleeds, congestion, sore throat, neck pain, tinnitus and ear discharge.  Eyes: Negative for blurred vision, double vision, photophobia, pain, discharge and redness.  Respiratory: Negative for cough, hemoptysis, sputum production, shortness of breath, wheezing and stridor.  Cardiovascular: Negative for chest pain, palpitations, orthopnea, leg swelling    Psychiatric/Behavioral: Negative for depression, suicidal ideas, hallucinations, memory loss and substance abuse. The patient is not nervous/anxious and does not have insomnia.  Extremities: Homans sign is negative, no sign of DVT    Assessment:  Dana Castillo is a 33 y.o. [phone] for sterilization with bilateral salpingectomy Plan:  Patient desires surgical management with bilateral salpingectomy.  The risks of surgery were discussed in detail with the patient including but not limited to: bleeding which may require transfusion or reoperation; infection which may require prolonged hospitalization or re-hospitalization and antibiotic therapy; injury to bowel, bladder, ureters and major vessels or other surrounding organs; need for additional procedures including laparotomy; thromboembolic phenomenon, incisional problems and other postoperative or anesthesia complications.  Patient was told that the likelihood that her condition and symptoms will be treated effectively with this surgical management was very high; the postoperative expectations were also discussed in detail. The patient also understands the alternative treatment options which were discussed in full. All questions were answered.

## 2013-09-06 NOTE — Anesthesia Preprocedure Evaluation (Addendum)
Anesthesia Evaluation  Patient identified by MRN, date of birth, ID band Patient awake    Reviewed: Allergy & Precautions, H&P , Patient's Chart, lab work & pertinent test results, reviewed documented beta blocker date and time   History of Anesthesia Complications Negative for: history of anesthetic complications  Airway Mallampati: II TM Distance: >3 FB Neck ROM: full    Dental   Pulmonary Current Smoker,  breath sounds clear to auscultation        Cardiovascular Exercise Tolerance: Good Rhythm:regular Rate:Normal     Neuro/Psych negative psych ROS   GI/Hepatic   Endo/Other    Renal/GU      Musculoskeletal   Abdominal   Peds  Hematology   Anesthesia Other Findings   Reproductive/Obstetrics                          Anesthesia Physical Anesthesia Plan  ASA: II  Anesthesia Plan: General ETT   Post-op Pain Management:    Induction:   Airway Management Planned:   Additional Equipment:   Intra-op Plan:   Post-operative Plan:   Informed Consent: I have reviewed the patients History and Physical, chart, labs and discussed the procedure including the risks, benefits and alternatives for the proposed anesthesia with the patient or authorized representative who has indicated his/her understanding and acceptance.   Dental Advisory Given  Plan Discussed with: CRNA, Surgeon and Anesthesiologist  Anesthesia Plan Comments:         Anesthesia Quick Evaluation

## 2013-09-06 NOTE — Discharge Instructions (Signed)
Salpingectomy, Care After  Refer to this sheet in the next few weeks. These instructions provide you with information on caring for yourself after your procedure. Your health care provider may also give you more specific instructions. Your treatment has been planned according to current medical practices, but problems sometimes occur. Call your health care provider if you have any problems or questions after your procedure.  WHAT TO EXPECT AFTER THE PROCEDURE  After your procedure, it is typical to have the following:  · Abdominal pain that can be controlled with pain medicine.  · Vaginal spotting.  · Tiredness.  HOME CARE INSTRUCTIONS  · Get plenty of rest and sleep.  · Only take over-the-counter or prescription medicines as directed by your health care provider.  · Keep incision areas clean and dry. Remove or change any bandages (dressings) only as directed by your health care provider.  · You may resume your regular diet. Eat a well-balanced diet.  · Drink enough fluids to keep your urine clear or pale yellow.  · Limit exercise and activities as directed by your health care provider. Do not lift anything heavier than 5 lb (2.3 kg) until your health care provider approves.  · Do not drive until your health care provider approves.  · Do not have sexual intercourse until your health care provider says it is okay.  · Take your temperature twice a day for the first week. Write those temperatures down.  · Follow up with your health care provider as directed.  SEEK MEDICAL CARE IF:  · You have pain when you urinate.  · You see pus coming out of the incision, or the incision is separating.  · You have increasing abdominal pain.  · You have swelling or redness in the incision area.  · You develop a rash.  · You feel lightheaded.  · You have pain that is not controlled with medicine.  SEEK IMMEDIATE MEDICAL CARE IF:  · You develop a fever.  · You have increasing abdominal pain.  · You develop chest or leg pain.  · You  develop shortness of breath.  · You pass out.  Document Released: 10/24/2010 Document Revised: 05/10/2013 Document Reviewed: 01/11/2013  ExitCare® Patient Information ©2014 [employer].

## 2013-09-06 NOTE — Anesthesia Postprocedure Evaluation (Signed)
  Anesthesia Post-op Note  Anesthesia Post Note  Patient: Dana Castillo  Procedure(s) Performed: Procedure(s) (LRB): LAPAROSCOPIC BILATERAL SALPINGECTOMY (Bilateral) HEMORRHAGIC CYST REMOVAL  (Right) LAPAROSCOPIC LYSIS OF ADHESIONS (N/A)  Anesthesia type: General  Patient location: PACU  Post pain: Pain level controlled  Post assessment: Post-op Vital signs reviewed  Last Vitals:  Filed Vitals:   09/06/13 1515  BP: 129/57  Pulse: 70  Temp: 37.3 C  Resp: 22    Post vital signs: Reviewed  Level of consciousness: sedated  Complications: No apparent anesthesia complications

## 2013-09-06 NOTE — Transfer of Care (Signed)
Immediate Anesthesia Transfer of Care Note  Patient: Dana Castillo  Procedure(s) Performed: Procedure(s): LAPAROSCOPIC BILATERAL SALPINGECTOMY (Bilateral) HEMORRHAGIC CYST REMOVAL  (Right) LAPAROSCOPIC LYSIS OF ADHESIONS (N/A)  Patient Location: PACU  Anesthesia Type:General  Level of Consciousness: awake, alert  and oriented  Airway & Oxygen Therapy: Patient Spontanous Breathing and Patient connected to nasal cannula oxygen  Post-op Assessment: Report given to PACU RN and Post -op Vital signs reviewed and stable  Post vital signs: Reviewed and stable  Complications: No apparent anesthesia complications

## 2013-09-08 ENCOUNTER — Encounter (HOSPITAL_COMMUNITY): Payer: Self-pay | Admitting: Obstetrics & Gynecology

## 2013-09-25 ENCOUNTER — Ambulatory Visit: Payer: BC Managed Care – PPO | Admitting: Obstetrics & Gynecology

## 2013-10-01 ENCOUNTER — Encounter (HOSPITAL_COMMUNITY)
Admission: RE | Admit: 2013-10-01 | Discharge: 2013-10-01 | Disposition: A | Discharge status: Home / Self Care | Payer: BC Managed Care – PPO | Source: Ambulatory Visit | Attending: Obstetrics & Gynecology | Admitting: Obstetrics & Gynecology

## 2013-10-01 DIAGNOSIS — O923 Agalactia: Secondary | ICD-10-CM | POA: Insufficient documentation

## 2013-11-01 ENCOUNTER — Encounter (HOSPITAL_COMMUNITY)
Admission: RE | Admit: 2013-11-01 | Discharge: 2013-11-01 | Disposition: A | Discharge status: Home / Self Care | Payer: BC Managed Care – PPO | Source: Ambulatory Visit | Attending: Obstetrics & Gynecology | Admitting: Obstetrics & Gynecology

## 2013-11-01 DIAGNOSIS — O923 Agalactia: Secondary | ICD-10-CM | POA: Insufficient documentation

## 2013-12-01 ENCOUNTER — Encounter (HOSPITAL_COMMUNITY)
Admission: RE | Admit: 2013-12-01 | Discharge: 2013-12-01 | Disposition: A | Discharge status: Home / Self Care | Payer: BC Managed Care – PPO | Source: Ambulatory Visit | Attending: Obstetrics & Gynecology | Admitting: Obstetrics & Gynecology

## 2013-12-01 DIAGNOSIS — O923 Agalactia: Secondary | ICD-10-CM | POA: Insufficient documentation

## 2013-12-22 ENCOUNTER — Ambulatory Visit: Payer: Self-pay

## 2013-12-22 LAB — RAPID STREP-A WITH REFLX: MICRO TEXT REPORT: NEGATIVE

## 2013-12-25 LAB — BETA STREP CULTURE(ARMC)

## 2014-04-04 ENCOUNTER — Ambulatory Visit: Payer: Self-pay

## 2014-05-08 ENCOUNTER — Ambulatory Visit: Payer: Self-pay | Admitting: Family Medicine

## 2014-06-04 ENCOUNTER — Encounter (HOSPITAL_COMMUNITY): Payer: Self-pay | Admitting: Obstetrics & Gynecology

## 2014-07-24 ENCOUNTER — Ambulatory Visit: Payer: Self-pay | Admitting: Family Medicine

## 2014-07-30 ENCOUNTER — Ambulatory Visit: Payer: Self-pay | Admitting: Family Medicine

## 2014-10-11 ENCOUNTER — Ambulatory Visit: Payer: Self-pay | Admitting: Otolaryngology

## 2014-11-15 ENCOUNTER — Ambulatory Visit
Admit: 2014-11-15 | Disposition: A | Discharge status: Home / Self Care | Payer: Self-pay | Attending: Family Medicine | Admitting: Family Medicine

## 2014-11-15 LAB — RAPID STREP-A WITH REFLX: MICRO TEXT REPORT: NEGATIVE

## 2014-11-18 LAB — BETA STREP CULTURE(ARMC)

## 2014-11-23 NOTE — Consult Note (Signed)
   Gravida 4   Para 0   Term Deliveries 0   Preterm Deliveries 0   Abortions 3   Final EDD (dd-mmm-yy) 06-Sep-2013   Blood Type (Maternal) A positive   Antibody Screen Results (Maternal) negative   HIV Results (Maternal) negative   Gonorrhea Results (Maternal) negative   Chlamydia Results (Maternal) negative   Hepatitis C Culture (Maternal) unknown   Herpes Results (Maternal) n/a   VDRL/RPR/Syphilis Results (Maternal) negative   Rubella Results (Maternal) immune   Hepatitis B Surface Antigen Results (Maternal) negative   Group B Strep Results Maternal (Result >5wks must be treated as unknown) unknown/result > 5 weeks ago    Additional Comments Asked by Dr. Ouida Sills to provide prenatal consultation for this 34 y.o  G4 P 0 Ab 3 mother  at 73 5/[redacted] wks EGA after she was admitted in preterm labor with bulging membranes and cervical dilatation to 5 cm.  She has no signs of infection and she has been given a dose of betamethasone and started on Magnesium sulfate and antibiotics.  Talked with patient and the FOB about usual treatment and expectations for preterm infants at 62 - 28 wks, including possible need for resuscitation, respiratory support, long-term IV access, hypothermia, feeding problems, and antibiotics.  Told them most babies born at this [place] survived without major complications, but that there were many potential problems. Presented uncertain LOS, possibly until 88 - [redacted] wks EGA.  Discussed desirability of feeding mother's milk - she plans to pump after delivery.  Patient and FOB were attentive and asked appropriate questions and expressed appreciation for my input.  Thank you for consulting Neonatology   [place]  face-to-face time 20 minutes   Electronic Signatures: Bettey Costa (MD)  (Signed 11-Nov-14 16:22)  Authored: PREGNANCY and LABOR, ADDITIONAL COMMENTS   Last Updated: 11-Nov-14 16:22 by Bettey Costa (MD)

## 2014-11-26 LAB — SURGICAL PATHOLOGY

## 2014-12-11 NOTE — H&P (Signed)
L&D Evaluation:  History:  HPI 34 yo G4P0030 with LMP of 11/29/12 & EDD of 09/06/13 with [place] at [facility] significant for OBESITY, Tobacco Abuse, 3 spont AB's, Hearing Defecit, Anxiety, ASCUS, Increased Risk for Down's (1:168), Low papp-a.   Presents with PELVIC PRESSURE, RECTAL ISSUE, dECREASED fm   Patient's Medical History sEE ABOVE   Patient's Surgical History MASTOIDECTOMY, X 2, rT EAR SURG X 7, lT EAR SURG X 4, d&C   Medications Pre Natal Vitamins   Allergies Codeine, causes upset stomach   Social History tobacco   Family History Non-Contributory   ROS:  ROS All systems were reviewed.  HEENT, CNS, GI, GU, Respiratory, CV, Renal and Musculoskeletal systems were found to be normal.   Exam:  Vital Signs stable   General no apparent distress   Mental Status clear   Chest clear   Heart normal sinus rhythm, no murmur/gallop/rubs   Abdomen gravid, non-tender   Back no CVAT   Edema 1+   Reflexes 1+   Pelvic 5/70/vtx-2   Mebranes Intact   FHT normal rate with no decels   Ucx regular   Skin dry   Lymph no lymphadenopathy   Plan:  Plan monitor contractions and for cervical change, Admit, BMZ x 2 doses, ABX, magSo4   Comments Dr Ouida Sills aware of pt status and has consulted and ordered MagSo4 for neuroprotection and ABX, BMZ.   Electronic Signatures: Catheryn Bacon (CNM)  (Signed [phone] 16:20)  Authored: L&D Evaluation   Last Updated: 10-Nov-14 16:20 by Catheryn Bacon (CNM)

## 2015-01-14 ENCOUNTER — Other Ambulatory Visit: Payer: Self-pay | Admitting: Internal Medicine

## 2015-01-14 ENCOUNTER — Other Ambulatory Visit: Payer: BLUE CROSS/BLUE SHIELD | Admitting: Internal Medicine

## 2015-01-14 DIAGNOSIS — Z1329 Encounter for screening for other suspected endocrine disorder: Secondary | ICD-10-CM

## 2015-01-14 DIAGNOSIS — Z13220 Encounter for screening for lipoid disorders: Secondary | ICD-10-CM

## 2015-01-14 DIAGNOSIS — Z1321 Encounter for screening for nutritional disorder: Secondary | ICD-10-CM

## 2015-01-14 DIAGNOSIS — Z0000 Encounter for general adult medical examination without abnormal findings: Secondary | ICD-10-CM

## 2015-01-14 DIAGNOSIS — Z130 Encounter for screening for diseases of the blood and blood-forming organs and certain disorders involving the immune mechanism: Secondary | ICD-10-CM

## 2015-01-14 LAB — COMPLETE METABOLIC PANEL WITH GFR
ALBUMIN: 3.9 g/dL (ref 3.5–5.2)
ALT: 19 U/L (ref 0–35)
AST: 15 U/L (ref 0–37)
Alkaline Phosphatase: 115 U/L (ref 39–117)
BUN: 11 mg/dL (ref 6–23)
CHLORIDE: 103 meq/L (ref 96–112)
CO2: 24 meq/L (ref 19–32)
Calcium: 9.5 mg/dL (ref 8.4–10.5)
Creat: 0.68 mg/dL (ref 0.50–1.10)
GFR, Est African American: >89 mL/min
GFR, Est Non African American: >89 mL/min
Glucose, Bld: 84 mg/dL (ref 70–99)
Potassium: 4.6 mEq/L (ref 3.5–5.3)
Sodium: 137 mEq/L (ref 135–145)
Total Bilirubin: 0.3 mg/dL (ref 0.2–1.2)
Total Protein: 7.5 g/dL (ref 6.0–8.3)

## 2015-01-14 LAB — CBC WITH DIFFERENTIAL/PLATELET
Basophils Absolute: 0 10*3/uL (ref 0.0–0.1)
Basophils Relative: 0 % (ref 0–1)
Eosinophils Absolute: 0.1 10*3/uL (ref 0.0–0.7)
Eosinophils Relative: 1 % (ref 0–5)
HCT: 47.7 % — ABNORMAL HIGH (ref 36.0–46.0)
Hemoglobin: 15.2 g/dL — ABNORMAL HIGH (ref 12.0–15.0)
LYMPHS PCT: 18 % (ref 12–46)
Lymphs Abs: 2.7 10*3/uL (ref 0.7–4.0)
MCH: 29.2 pg (ref 26.0–34.0)
MCHC: 31.9 g/dL (ref 30.0–36.0)
MCV: 91.7 fL (ref 78.0–100.0)
MPV: 9.6 fL (ref 8.6–12.4)
Monocytes Absolute: 1.2 10*3/uL — ABNORMAL HIGH (ref 0.1–1.0)
Monocytes Relative: 8 % (ref 3–12)
NEUTROS PCT: 73 % (ref 43–77)
Neutro Abs: 10.8 10*3/uL — ABNORMAL HIGH (ref 1.7–7.7)
Platelets: 351 10*3/uL (ref 150–400)
RBC: 5.2 MIL/uL — AB (ref 3.87–5.11)
RDW: 14.2 % (ref 11.5–15.5)
WBC: 14.8 10*3/uL — ABNORMAL HIGH (ref 4.0–10.5)

## 2015-01-14 LAB — LIPID PANEL
CHOL/HDL RATIO: 4.3 ratio
CHOLESTEROL: 183 mg/dL (ref 0–200)
HDL: 43 mg/dL — ABNORMAL LOW (ref >=46)
LDL Cholesterol: 117 mg/dL — ABNORMAL HIGH (ref 0–99)
TRIGLYCERIDES: 116 mg/dL (ref <150)
VLDL: 23 mg/dL (ref 0–40)

## 2015-01-14 LAB — TSH: TSH: 2.921 u[IU]/mL (ref 0.350–4.500)

## 2015-01-15 ENCOUNTER — Other Ambulatory Visit (HOSPITAL_COMMUNITY)
Admission: RE | Admit: 2015-01-15 | Discharge: 2015-01-15 | Disposition: A | Discharge status: Home / Self Care | Payer: BLUE CROSS/BLUE SHIELD | Source: Ambulatory Visit | Attending: Internal Medicine | Admitting: Internal Medicine

## 2015-01-15 ENCOUNTER — Ambulatory Visit (INDEPENDENT_AMBULATORY_CARE_PROVIDER_SITE_OTHER): Payer: BLUE CROSS/BLUE SHIELD | Admitting: Internal Medicine

## 2015-01-15 VITALS — BP 128/64 | HR 100 | Temp 99.1°F | Ht 64.0 in | Wt 232.0 lb

## 2015-01-15 DIAGNOSIS — Z01419 Encounter for gynecological examination (general) (routine) without abnormal findings: Secondary | ICD-10-CM | POA: Diagnosis not present

## 2015-01-15 DIAGNOSIS — E282 Polycystic ovarian syndrome: Secondary | ICD-10-CM

## 2015-01-15 DIAGNOSIS — E559 Vitamin D deficiency, unspecified: Secondary | ICD-10-CM

## 2015-01-15 DIAGNOSIS — Z87891 Personal history of nicotine dependence: Secondary | ICD-10-CM | POA: Diagnosis not present

## 2015-01-15 DIAGNOSIS — D72829 Elevated white blood cell count, unspecified: Secondary | ICD-10-CM

## 2015-01-15 DIAGNOSIS — H9191 Unspecified hearing loss, right ear: Secondary | ICD-10-CM

## 2015-01-15 DIAGNOSIS — E669 Obesity, unspecified: Secondary | ICD-10-CM

## 2015-01-15 DIAGNOSIS — L680 Hirsutism: Secondary | ICD-10-CM

## 2015-01-15 DIAGNOSIS — E7800 Pure hypercholesterolemia, unspecified: Secondary | ICD-10-CM

## 2015-01-15 DIAGNOSIS — Z0000 Encounter for general adult medical examination without abnormal findings: Secondary | ICD-10-CM | POA: Diagnosis not present

## 2015-01-15 DIAGNOSIS — R519 Headache, unspecified: Secondary | ICD-10-CM

## 2015-01-15 DIAGNOSIS — R51 Headache: Secondary | ICD-10-CM

## 2015-01-15 LAB — POCT URINALYSIS DIPSTICK
BILIRUBIN UA: NEGATIVE
Blood, UA: NEGATIVE
GLUCOSE UA: NEGATIVE
Ketones, UA: NEGATIVE
Leukocytes, UA: NEGATIVE
Nitrite, UA: NEGATIVE
PROTEIN UA: NEGATIVE
SPEC GRAV UA: 1.02
UROBILINOGEN UA: NEGATIVE
pH, UA: 6.5

## 2015-01-15 LAB — HEMOGLOBIN A1C
Hgb A1c MFr Bld: 5.6 % (ref <5.7)
MEAN PLASMA GLUCOSE: 114 mg/dL (ref <117)

## 2015-01-15 LAB — VITAMIN D 25 HYDROXY (VIT D DEFICIENCY, FRACTURES): VIT D 25 HYDROXY: 27 ng/mL — AB (ref 30–100)

## 2015-01-16 ENCOUNTER — Telehealth: Payer: Self-pay | Admitting: *Deleted

## 2015-01-16 NOTE — Telephone Encounter (Signed)
Reviewed lab results with patient.

## 2015-01-17 LAB — CYTOLOGY - PAP

## 2015-01-28 ENCOUNTER — Encounter: Payer: Self-pay | Admitting: Internal Medicine
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
## 2015-01-28 NOTE — Patient Instructions (Addendum)
Encouraged diet exercise and weight loss. Patient needs to stop smoking. She is not interested in trying Chantix. Not interested in Wellbutrin at the present time. Recommended 2000 units vitamin D 3 daily. Does not want to be back on metformin at the present time. Her white blood cell count is elevated today for some unknown reason. It was previously within normal limits at [facility] clinic June 2015. Suggest repeating CBC in 3 months.

## 2015-05-31 ENCOUNTER — Ambulatory Visit
Admission: EM | Admit: 2015-05-31 | Discharge: 2015-05-31 | Disposition: A | Discharge status: Home / Self Care | Payer: BLUE CROSS/BLUE SHIELD | Attending: Family Medicine | Admitting: Family Medicine

## 2015-05-31 ENCOUNTER — Ambulatory Visit: Payer: BLUE CROSS/BLUE SHIELD

## 2015-05-31 ENCOUNTER — Ambulatory Visit
Admission: EM | Admit: 2015-05-31 | Discharge: 2015-05-31 | Discharge status: Absent without leave | Payer: BLUE CROSS/BLUE SHIELD

## 2015-05-31 DIAGNOSIS — D3502 Benign neoplasm of left adrenal gland: Secondary | ICD-10-CM

## 2015-05-31 DIAGNOSIS — N83202 Unspecified ovarian cyst, left side: Secondary | ICD-10-CM

## 2015-05-31 DIAGNOSIS — K8020 Calculus of gallbladder without cholecystitis without obstruction: Secondary | ICD-10-CM | POA: Diagnosis not present

## 2015-05-31 DIAGNOSIS — R109 Unspecified abdominal pain: Secondary | ICD-10-CM

## 2015-05-31 DIAGNOSIS — R1012 Left upper quadrant pain: Secondary | ICD-10-CM

## 2015-05-31 DIAGNOSIS — R1010 Upper abdominal pain, unspecified: Secondary | ICD-10-CM

## 2015-05-31 DIAGNOSIS — E669 Obesity, unspecified: Secondary | ICD-10-CM

## 2015-05-31 DIAGNOSIS — M545 Low back pain, unspecified: Secondary | ICD-10-CM

## 2015-05-31 LAB — CBC WITH DIFFERENTIAL/PLATELET
Basophils Absolute: 0.1 10*3/uL (ref 0–0.1)
Basophils Relative: 1 %
Eosinophils Absolute: 0.1 10*3/uL (ref 0–0.7)
Eosinophils Relative: 1 %
HCT: 43.5 % (ref 35.0–47.0)
Hemoglobin: 14.1 g/dL (ref 12.0–16.0)
Lymphocytes Relative: 24 %
Lymphs Abs: 4 10*3/uL — ABNORMAL HIGH (ref 1.0–3.6)
MCH: 28.5 pg (ref 26.0–34.0)
MCHC: 32.4 g/dL (ref 32.0–36.0)
MCV: 87.9 fL (ref 80.0–100.0)
Monocytes Absolute: 1 10*3/uL — ABNORMAL HIGH (ref 0.2–0.9)
Monocytes Relative: 6 %
Neutro Abs: 11.1 10*3/uL — ABNORMAL HIGH (ref 1.4–6.5)
Neutrophils Relative %: 68 %
Platelets: 302 10*3/uL (ref 150–440)
RBC: 4.95 MIL/uL (ref 3.80–5.20)
RDW: 14.3 % (ref 11.5–14.5)
WBC: 16.3 10*3/uL — ABNORMAL HIGH (ref 3.6–11.0)

## 2015-05-31 LAB — BASIC METABOLIC PANEL
Anion gap: 10 (ref 5–15)
BUN: 8 mg/dL (ref 6–20)
CO2: 26 mmol/L (ref 22–32)
Calcium: 8.9 mg/dL (ref 8.9–10.3)
Chloride: 100 mmol/L — ABNORMAL LOW (ref 101–111)
Creatinine, Ser: 0.71 mg/dL (ref 0.44–1.00)
GFR calc Af Amer: >60 mL/min (ref >60)
GFR calc non Af Amer: >60 mL/min (ref >60)
Glucose, Bld: 82 mg/dL (ref 65–99)
Potassium: 3.6 mmol/L (ref 3.5–5.1)
Sodium: 136 mmol/L (ref 135–145)

## 2015-05-31 LAB — URINALYSIS COMPLETE WITH MICROSCOPIC (ARMC ONLY)
Bilirubin Urine: NEGATIVE
GLUCOSE, UA: NEGATIVE mg/dL
Ketones, ur: NEGATIVE mg/dL
NITRITE: NEGATIVE
PH: 5.5 (ref 5.0–8.0)
Protein, ur: NEGATIVE mg/dL
SPECIFIC GRAVITY, URINE: 1.01 (ref 1.005–1.030)

## 2015-05-31 MED ORDER — HYDROCODONE-ACETAMINOPHEN 5-325 MG PO TABS: 1.0000 | ORAL_TABLET | Freq: Four times a day (QID) | ORAL | Status: AC | PRN

## 2015-05-31 MED ORDER — CYCLOBENZAPRINE HCL 10 MG PO TABS: 10.0000 mg | ORAL_TABLET | Freq: Two times a day (BID) | ORAL | Status: AC | PRN

## 2015-05-31 MED ORDER — KETOROLAC TROMETHAMINE 30 MG/ML IJ SOLN
30.0000 mg | Freq: Once | INTRAMUSCULAR | Status: AC
  Administered 2015-05-31: 30 mg via INTRAVENOUS

## 2015-05-31 MED ORDER — SODIUM CHLORIDE 0.9 % IV BOLUS (SEPSIS)
1000.0000 mL | Freq: Once | INTRAVENOUS | Status: AC
  Administered 2015-05-31: 1000 mL via INTRAVENOUS

## 2015-05-31 MED ORDER — NAPROXEN 375 MG PO TABS: 375.0000 mg | ORAL_TABLET | Freq: Two times a day (BID) | ORAL | Status: AC

## 2015-05-31 NOTE — ED Notes (Signed)
Started this morning with left hip pain that moved up into left flank.Pain radiates through to LLQ.  + nausea when increased pain

## 2015-05-31 NOTE — ED Provider Notes (Signed)
CSN: [id number]     Arrival date & time 05/31/15  1754 History   First MD Initiated Contact with Patient 05/31/15 1757     No chief complaint on file.  (Consider location/radiation/quality/duration/timing/severity/associated sxs/prior Treatment) HPI  Past Medical History  Diagnosis Date  . Anxiety   . PCOS (polycystic ovarian syndrome)   . Cholesteatoma of both ears   . History of mastoidectomy   . Obesity   . Abnormal Pap smear     HPV  . Vaginal delivery 2014   Past Surgical History  Procedure Laterality Date  . Dilation and curettage of uterus    . Mastoidectomy    . Laparoscopic ovarian    . Laparoscopic bilateral salpingectomy Bilateral 09/06/2013    Procedure: LAPAROSCOPIC BILATERAL SALPINGECTOMY;  Surgeon: Lavonia Drafts, MD;  Location: [facility];  Service: Gynecology;  Laterality: Bilateral;  . Ear cyst excision Right 09/06/2013    Procedure: HEMORRHAGIC CYST REMOVAL ;  Surgeon: Lavonia Drafts, MD;  Location: [facility];  Service: Gynecology;  Laterality: Right;  . Laparoscopic lysis of adhesions N/A 09/06/2013    Procedure: LAPAROSCOPIC LYSIS OF ADHESIONS;  Surgeon: Lavonia Drafts, MD;  Location: [facility];  Service: Gynecology;  Laterality: N/A;  . Mastoidectomy    . Mastoidectomy revision    . Tubal ligation     Family History  Problem Relation Age of Onset  . Cancer Mother   . Diabetes Mother   . Hypertension Mother   . Mental illness Mother   . Diabetes Father   . Heart disease Father   . Hypertension Father    Social History  Substance Use Topics  . Smoking status: Current Every Day Smoker -- 1.00 packs/day for 20 years    Types: Cigarettes  . Smokeless tobacco: Not on file  . Alcohol Use: Yes     Comment: occ   OB History    Gravida Para Term Preterm AB TAB SAB Ectopic Multiple Living   4 1  1 3  3   1      Review of Systems  Allergies  Codeine; Vicodin; and Latex  Home Medications   Prior to Admission medications   Medication  Sig Start Date End Date Taking? Authorizing Provider  cyclobenzaprine (FLEXERIL) 10 MG tablet Take 1 tablet (10 mg total) by mouth every 12 (twelve) hours as needed for muscle spasms. 66/59/93   Tori Milks, NP  HYDROcodone-acetaminophen (NORCO) 5-325 MG tablet Take 1 tablet by mouth every 6 (six) hours as needed for moderate pain. 57/01/77   Tori Milks, NP  naproxen (NAPROSYN) 375 MG tablet Take 1 tablet (375 mg total) by mouth 2 (two) times daily. 93/90/30   Tori Milks, NP   Meds Ordered and Administered this Visit  Medications - No data to display  LMP 05/05/2015 (Exact Date)  Breastfeeding? No No data found.   Physical Exam  ED Course  Procedures (including critical care time)  Labs Review Labs Reviewed - No data to display  Imaging Review Ct Renal Stone Study  05/31/2015  CLINICAL DATA:  Left-sided abdominal pain EXAM: CT ABDOMEN AND PELVIS WITHOUT CONTRAST TECHNIQUE: Multidetector CT imaging of the abdomen and pelvis was performed following the standard protocol without oral or intravenous contrast material administration. COMPARISON:  None. FINDINGS: Lower chest:  Lung bases are clear. Hepatobiliary: Liver is prominent, measuring 20.0 cm in length. No focal liver lesions are identified on this noncontrast enhanced study. There are multiple gallstones within the gallbladder. The gallbladder wall is not appreciably thickened. There  is no biliary duct dilatation. Pancreas: No mass or inflammatory focus. Spleen: No splenic lesions are appreciable. Adrenals/Urinary Tract: Right adrenal appears normal. There is a 0.9 x 0.9 cm medial left adrenal adenoma. Kidneys bilaterally show no mass or hydronephrosis on either side. There is no renal or ureteral calculus on either side. Urinary bladder is midline with wall thickness within normal limits. Stomach/Bowel: There are multiple descending colonic and sigmoid diverticula without demonstrable diverticulitis. There is no  appreciable bowel wall or mesenteric thickening. No bowel obstruction. No free air or portal venous air. Vascular/Lymphatic: There is no demonstrable abdominal aortic aneurysm. No vascular lesions are identified on this noncontrast enhanced study. There is no appreciable adenopathy in the abdomen or pelvis. Reproductive: Uterus is anteverted. There is a paraovarian cyst posterior to the left ovary measuring 2.1 x 2.1 cm. No other pelvic mass. Other: The appendix appears normal. No abscess or ascites is seen in the abdomen or pelvis. There is a minimal ventral hernia containing only fat. Musculoskeletal: There are no blastic or lytic bone lesions. No intramuscular lesions are identified. Soft tissue stranding is noted in the fatty tissues posterior to the lumbar region without focal mass or fluid collection. IMPRESSION: Cholelithiasis. Prominent liver without focal lesion on this noncontrast enhanced study. Small left adrenal adenoma, a benign finding. Multiple colonic diverticula without demonstrable diverticulitis. No bowel obstruction. No abscess. Appendix appears normal. Small left ovarian paraovarian cyst. Nonspecific soft tissue stranding in the fat posterior to the lumbar spine. This finding may be secondary to nonspecific inflammation or previous trauma. There is no well-defined fluid collection or hematoma in this area. Electronically Signed   By: Lowella Grip III M.D.   On: 05/31/2015 19:12     Visual Acuity Review  Right Eye Distance:   Left Eye Distance:   Bilateral Distance:    Right Eye Near:   Left Eye Near:    Bilateral Near:         MDM   1. Acute flank pain     This needs to be merged, pt walked out to car and came back in immediately, office staff created new notation. Please see other chart. 1 charge was [id number] and other is No charge as it is duplicate, Dr. Alveta Heimlich aware.   Tori Milks, NP 31/54/00 [id number]

## 2015-05-31 NOTE — ED Notes (Signed)
Spoke w [employer] representative "Maudie Mercury" that no prior authorization is required for CT renal stone study for acute left flank pain

## 2015-05-31 NOTE — ED Provider Notes (Signed)
CSN: [id number]     Arrival date & time 05/31/15  1413 History   First MD Initiated Contact with Patient 05/31/15 1507     Chief Complaint  Patient presents with  . Flank Pain   (Consider location/radiation/quality/duration/timing/severity/associated sxs/prior Treatment) Patient is a 34 y.o. female presenting with flank pain. The history is provided by the patient. No language interpreter was used.  Flank Pain This is a recurrent (pt has hx of kidney stones, last wepisode was 2011 "feels the same") problem. The current episode started 6 to 12 hours ago. The problem occurs constantly. The problem has not changed since onset.Pertinent negatives include no chest pain, no abdominal pain, no headaches and no shortness of breath. Associated symptoms comments: nausea. Exacerbated by: urination. Relieved by: standing, leaning forward. Treatments tried: ibuprofen this am. The treatment provided no relief.    Past Medical History  Diagnosis Date  . Anxiety   . PCOS (polycystic ovarian syndrome)   . Cholesteatoma of both ears   . History of mastoidectomy   . Obesity   . Abnormal Pap smear     HPV  . Vaginal delivery 2014   Past Surgical History  Procedure Laterality Date  . Dilation and curettage of uterus    . Mastoidectomy    . Laparoscopic ovarian    . Laparoscopic bilateral salpingectomy Bilateral 09/06/2013    Procedure: LAPAROSCOPIC BILATERAL SALPINGECTOMY;  Surgeon: Lavonia Drafts, MD;  Location: [facility];  Service: Gynecology;  Laterality: Bilateral;  . Ear cyst excision Right 09/06/2013    Procedure: HEMORRHAGIC CYST REMOVAL ;  Surgeon: Lavonia Drafts, MD;  Location: [facility];  Service: Gynecology;  Laterality: Right;  . Laparoscopic lysis of adhesions N/A 09/06/2013    Procedure: LAPAROSCOPIC LYSIS OF ADHESIONS;  Surgeon: Lavonia Drafts, MD;  Location: [facility];  Service: Gynecology;  Laterality: N/A;  . Mastoidectomy    . Mastoidectomy revision    . Tubal ligation       Family History  Problem Relation Age of Onset  . Cancer Mother   . Diabetes Mother   . Hypertension Mother   . Mental illness Mother   . Diabetes Father   . Heart disease Father   . Hypertension Father    Social History  Substance Use Topics  . Smoking status: Current Every Day Smoker -- 1.00 packs/day for 20 years    Types: Cigarettes  . Smokeless tobacco: None  . Alcohol Use: Yes     Comment: occ   OB History    Gravida Para Term Preterm AB TAB SAB Ectopic Multiple Living   4 1  1 3  3   1      Review of Systems  Constitutional: Positive for activity change. Negative for fever and chills.  HENT: Negative.   Respiratory: Negative.  Negative for shortness of breath.   Cardiovascular: Negative for chest pain, palpitations and leg swelling.  Gastrointestinal: Negative for abdominal pain.  Endocrine: Negative.   Genitourinary: Positive for dysuria, frequency and flank pain. Negative for hematuria, menstrual problem and pelvic pain.       LMP 05/05/15  Musculoskeletal: Positive for myalgias and back pain. Negative for gait problem.  Skin: Negative for rash.  Neurological: Negative for headaches.  Hematological: Negative.   Psychiatric/Behavioral: Negative.   All other systems reviewed and are negative.   Allergies  Codeine; Vicodin; and Latex  Home Medications   Prior to Admission medications   Medication Sig Start Date End Date Taking? Authorizing Provider  cyclobenzaprine (FLEXERIL) 10  MG tablet Take 1 tablet (10 mg total) by mouth every 12 (twelve) hours as needed for muscle spasms. 40/97/35   Tori Milks, NP  HYDROcodone-acetaminophen (NORCO) 5-325 MG tablet Take 1 tablet by mouth every 6 (six) hours as needed for moderate pain. 32/99/24   Tori Milks, NP  naproxen (NAPROSYN) 375 MG tablet Take 1 tablet (375 mg total) by mouth 2 (two) times daily. 26/83/41   Tori Milks, NP   Meds Ordered and Administered this Visit   Medications  sodium  chloride 0.9 % bolus 1,000 mL (1,000 mLs Intravenous Given 05/31/15 1605)  ketorolac (TORADOL) 30 MG/ML injection 30 mg (30 mg Intravenous Given 05/31/15 1608)    BP 133/69 mmHg  Pulse 86  Temp(Src) 97.3 F (36.3 C) (Tympanic)  Resp 18  Ht 5\' 4"  (1.626 m)  Wt 230 lb (104.327 kg)  BMI 39.46 kg/m2  SpO2 100%  LMP 05/05/2015 (Exact Date)  Breastfeeding? No No data found.   Physical Exam  Constitutional: She is oriented to person, place, and time. Vital signs are normal. She appears well-developed and well-nourished. She is active and cooperative. She appears distressed.  HENT:  Head: Normocephalic.  Eyes: Pupils are equal, round, and reactive to light.  Cardiovascular: Normal rate and regular rhythm.   Pulmonary/Chest: Effort normal.  Musculoskeletal: Normal range of motion. She exhibits tenderness.       Lumbar back: She exhibits tenderness, pain and spasm.       Back:  Neurological: She is alert and oriented to person, place, and time. No cranial nerve deficit or sensory deficit. Coordination normal. GCS eye subscore is 4. GCS verbal subscore is 5. GCS motor subscore is 6.  -SLE bilateral, no loss of bowel and bladder, no saddle numbness  Skin: Skin is warm and dry.  Psychiatric: She has a normal mood and affect. Her behavior is normal.  Nursing note and vitals reviewed.   ED Course  Procedures (including critical care time)  Labs Review Labs Reviewed  URINALYSIS COMPLETEWITH MICROSCOPIC ([place]) - Abnormal; Notable for the following:    Hgb urine dipstick TRACE (*)    Leukocytes, UA TRACE (*)    Bacteria, UA FEW (*)    Squamous Epithelial / LPF 6-30 (*)    All other components within normal limits  CBC WITH DIFFERENTIAL/PLATELET - Abnormal; Notable for the following:    WBC 16.3 (*)    Neutro Abs 11.1 (*)    Lymphs Abs 4.0 (*)    Monocytes Absolute 1.0 (*)    All other components within normal limits  BASIC METABOLIC PANEL - Abnormal; Notable for the  following:    Chloride 100 (*)    All other components within normal limits  URINE CULTURE    Imaging Review Ct Renal Stone Study  05/31/2015  CLINICAL DATA:  Left-sided abdominal pain EXAM: CT ABDOMEN AND PELVIS WITHOUT CONTRAST TECHNIQUE: Multidetector CT imaging of the abdomen and pelvis was performed following the standard protocol without oral or intravenous contrast material administration. COMPARISON:  None. FINDINGS: Lower chest:  Lung bases are clear. Hepatobiliary: Liver is prominent, measuring 20.0 cm in length. No focal liver lesions are identified on this noncontrast enhanced study. There are multiple gallstones within the gallbladder. The gallbladder wall is not appreciably thickened. There is no biliary duct dilatation. Pancreas: No mass or inflammatory focus. Spleen: No splenic lesions are appreciable. Adrenals/Urinary Tract: Right adrenal appears normal. There is a 0.9 x 0.9 cm medial left adrenal adenoma. Kidneys bilaterally show no mass  or hydronephrosis on either side. There is no renal or ureteral calculus on either side. Urinary bladder is midline with wall thickness within normal limits. Stomach/Bowel: There are multiple descending colonic and sigmoid diverticula without demonstrable diverticulitis. There is no appreciable bowel wall or mesenteric thickening. No bowel obstruction. No free air or portal venous air. Vascular/Lymphatic: There is no demonstrable abdominal aortic aneurysm. No vascular lesions are identified on this noncontrast enhanced study. There is no appreciable adenopathy in the abdomen or pelvis. Reproductive: Uterus is anteverted. There is a paraovarian cyst posterior to the left ovary measuring 2.1 x 2.1 cm. No other pelvic mass. Other: The appendix appears normal. No abscess or ascites is seen in the abdomen or pelvis. There is a minimal ventral hernia containing only fat. Musculoskeletal: There are no blastic or lytic bone lesions. No intramuscular lesions are  identified. Soft tissue stranding is noted in the fatty tissues posterior to the lumbar region without focal mass or fluid collection. IMPRESSION: Cholelithiasis. Prominent liver without focal lesion on this noncontrast enhanced study. Small left adrenal adenoma, a benign finding. Multiple colonic diverticula without demonstrable diverticulitis. No bowel obstruction. No abscess. Appendix appears normal. Small left ovarian paraovarian cyst. Nonspecific soft tissue stranding in the fat posterior to the lumbar spine. This finding may be secondary to nonspecific inflammation or previous trauma. There is no well-defined fluid collection or hematoma in this area. Electronically Signed   By: Lowella Grip III M.D.   On: 05/31/2015 19:12    Results for orders placed or performed during the hospital encounter of 05/31/15  Urinalysis complete, with microscopic  Result Value Ref Range   Color, Urine YELLOW YELLOW   APPearance CLEAR CLEAR   Glucose, UA NEGATIVE NEGATIVE mg/dL   Bilirubin Urine NEGATIVE NEGATIVE   Ketones, ur NEGATIVE NEGATIVE mg/dL   Specific Gravity, Urine 1.010 1.005 - 1.030   Hgb urine dipstick TRACE (A) NEGATIVE   pH 5.5 5.0 - 8.0   Protein, ur NEGATIVE NEGATIVE mg/dL   Nitrite NEGATIVE NEGATIVE   Leukocytes, UA TRACE (A) NEGATIVE   RBC / HPF 0-5 <3 RBC/hpf   WBC, UA 0-5 <3 WBC/hpf   Bacteria, UA FEW (A) RARE   Squamous Epithelial / LPF 6-30 (A) RARE  CBC with Differential  Result Value Ref Range   WBC 16.3 (H) 3.6 - 11.0 K/uL   RBC 4.95 3.80 - 5.20 MIL/uL   Hemoglobin 14.1 12.0 - 16.0 g/dL   HCT 43.5 35.0 - 47.0 %   MCV 87.9 80.0 - 100.0 fL   MCH 28.5 26.0 - 34.0 pg   MCHC 32.4 32.0 - 36.0 g/dL   RDW 14.3 11.5 - 14.5 %   Platelets 302 150 - 440 K/uL   Neutrophils Relative % 68 %   Neutro Abs 11.1 (H) 1.4 - 6.5 K/uL   Lymphocytes Relative 24 %   Lymphs Abs 4.0 (H) 1.0 - 3.6 K/uL   Monocytes Relative 6 %   Monocytes Absolute 1.0 (H) 0.2 - 0.9 K/uL   Eosinophils  Relative 1 %   Eosinophils Absolute 0.1 0 - 0.7 K/uL   Basophils Relative 1 %   Basophils Absolute 0.1 0 - 0.1 K/uL  Basic metabolic panel  Result Value Ref Range   Sodium 136 135 - 145 mmol/L   Potassium 3.6 3.5 - 5.1 mmol/L   Chloride 100 (L) 101 - 111 mmol/L   CO2 26 22 - 32 mmol/L   Glucose, Bld 82 65 - 99 mg/dL   BUN  8 6 - 20 mg/dL   Creatinine, Ser 0.71 0.44 - 1.00 mg/dL   Calcium 8.9 8.9 - 10.3 mg/dL   GFR calc non Af Amer >60 >60 mL/min   GFR calc Af Amer >60 >60 mL/min   Anion gap 10 5 - 15      MDM   1. Left-sided low back pain without sciatica   2. Acute left flank pain   3. Gallstones   4. Adrenal adenoma, left   5. Ovarian cyst, left   6. Obesity    1530: UA reviewed shows few leuks, Labs pending(UC, CBC w diff, BMP), IV NS 1 l bolus, Toradol 30 mg IV for pain(no driver). Discussed DDX: low back pain/muscle spasm, flank pain, UTI/kidney stone. Ct renal ordered. Pt verbalized understanding to this provider.   1741: Pt left AMA" had to go pick up child"  1755: Pt returned, placed in treatment room, awaiting CT scan. Reviewed CBC/BMP: CBC has elevated WBC 16.3, neut 68%. Pt reassessed states back pain had improved with Toradol now it is returning.  1835: Pt to CT scanner.  1955: Reviewed CT results with Dr. Alveta Heimlich, will refer to PCP/surgeon regarding gallstones. Will treat with pain med(norco), muscle relaxer for back pain(flexeril/naproxen-NSAID), no kidney stones. Pt has prominent liver/ has left adrenal mass-will refer to PCP for further evaluation and treatment-need referral to nephrologist/GI specialist from PCP for further evaluation. Pt also has small paraovarian cyst-left:will need to have managed by PCP-may need referral to gyn from PCP. Copy of CT report given to pt. Pt verbalized understanding to this provider.   Tori Milks, NP 38/88/75 [id number]

## 2015-06-02 LAB — URINE CULTURE: SPECIAL REQUESTS: NORMAL

## 2015-06-03 NOTE — ED Notes (Signed)
Final report of UA culture negative for UTI

## 2015-06-10 ENCOUNTER — Encounter: Payer: Self-pay | Admitting: Internal Medicine

## 2015-06-10 ENCOUNTER — Ambulatory Visit (INDEPENDENT_AMBULATORY_CARE_PROVIDER_SITE_OTHER): Payer: BLUE CROSS/BLUE SHIELD | Admitting: Internal Medicine

## 2015-06-10 ENCOUNTER — Telehealth: Payer: Self-pay | Admitting: Internal Medicine

## 2015-06-10 VITALS — BP 130/76 | HR 87 | Temp 98.4°F | Resp 18 | Ht 64.0 in | Wt 225.0 lb

## 2015-06-10 DIAGNOSIS — K5730 Diverticulosis of large intestine without perforation or abscess without bleeding: Secondary | ICD-10-CM | POA: Diagnosis not present

## 2015-06-10 DIAGNOSIS — R109 Unspecified abdominal pain: Secondary | ICD-10-CM

## 2015-06-10 DIAGNOSIS — N83202 Unspecified ovarian cyst, left side: Secondary | ICD-10-CM | POA: Diagnosis not present

## 2015-06-10 DIAGNOSIS — E669 Obesity, unspecified: Secondary | ICD-10-CM

## 2015-06-10 DIAGNOSIS — K8020 Calculus of gallbladder without cholecystitis without obstruction: Secondary | ICD-10-CM

## 2015-06-10 DIAGNOSIS — Z23 Encounter for immunization: Secondary | ICD-10-CM | POA: Diagnosis not present

## 2015-06-10 NOTE — Telephone Encounter (Signed)
Appt made with Pauline Good @ [facility].  Dx:  Left Ovarian Cyst (spoke with Donnamarie Rossetti @ [phone]).  Faxed documentation to [phone] Tuesday, 07/09/15 @ 10:45.  Spoke with patient and advised of date/time.  Patient confirmed time and is familiar with practice and address.

## 2015-06-10 NOTE — Progress Notes (Signed)
   Subjective:    Patient ID: Dana Castillo, female    DOB: 08-01-1981, 34 y.o.   MRN: [id number]  HPI Patient was sent here for follow-up by [facility]. She was seen there on October 28 with flank pain. She thought she had a kidney stone. There was no evidence of stone. However she had multiple other findings including multiple gallstones. Currently asymptomatic from that. She had a prominent liver. Felt possibly to be fatty liver. She had a 2.1 x 2.1 cm left ovarian/paraovarian cyst.  She has not been seen by GYN since the birth of her child. She usually sees someone at [facility] in [place]. She had a small left adrenal adenoma. She also was found to have diverticulosis. I think all of these findings of overwhelmed and frightened her. We went through these one by one today. Since she is asymptomatic with gallstones, will simply leave that alone. She is overweight needs to lose weight. Diverticulosis is not uncommon. She's never had acute diverticulitis. Adrenal adenoma is 0.9 x 0.9 cm which is quite small.  However I would like for her to see GYN regarding left ovarian/paraovarian cyst.    Review of Systems     Objective:   Physical Exam On exam, no hepatosplenomegaly masses or significant tenderness. Abdomen is soft obese nondistended without hepatosplenomegaly by percussion. No liver tip is felt. No masses appreciated in the abdomen. Pelvic exam not done this time. She had pelvic exam done here in June which was normal.       Assessment & Plan:  Multiple asymptomatic gallstones  Fatty liver-.and exercise discussed  Obesity-discussed once again diet and exercise  Diverticulosis-asymptomatic and no history of diverticulitis  Flank pain-improved with no evidence of kidney stone. Likely had musculoskeletal pain.  2.1 x 2.1 left ovarian paraovarian cyst which needs evaluation by OB/GYN. Appointment made for December.

## 2015-06-10 NOTE — Patient Instructions (Signed)
Appointment made for GYN in December for follow-up on ovarian cyst.

## 2016-05-06 IMAGING — CT CT RENAL STONE PROTOCOL
2 of 4 series · 16 of 46 positions shown, 18 images · non-contrast
Comparison: None.

CLINICAL DATA: Left-sided abdominal pain

EXAM:
CT ABDOMEN AND PELVIS WITHOUT CONTRAST
TECHNIQUE: Multidetector CT imaging of the abdomen and pelvis was performed
following the standard protocol without oral or intravenous contrast
material administration.

[Series 2: soft tissue · axial · 0.76mm/px · z∈[-860,-450]mm · 13 of 90 slices shown, 15 images]
[im 4/90  soft-tissue]
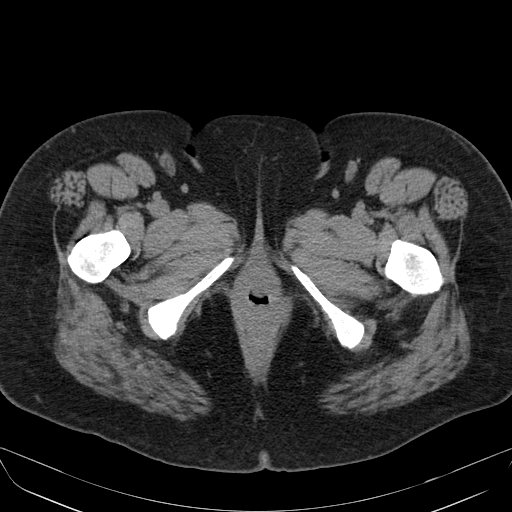
[im 4/90  bone]
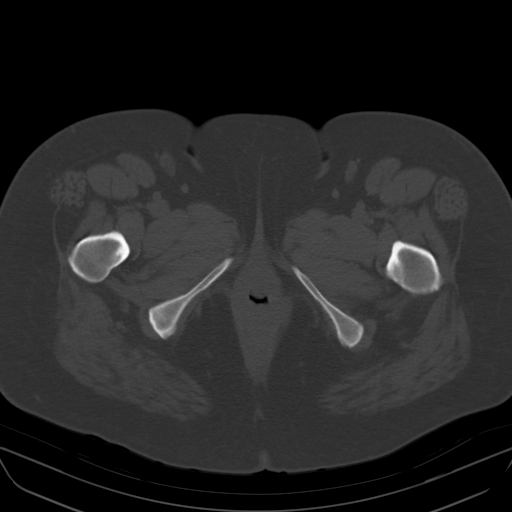
[im 11/90  soft-tissue]
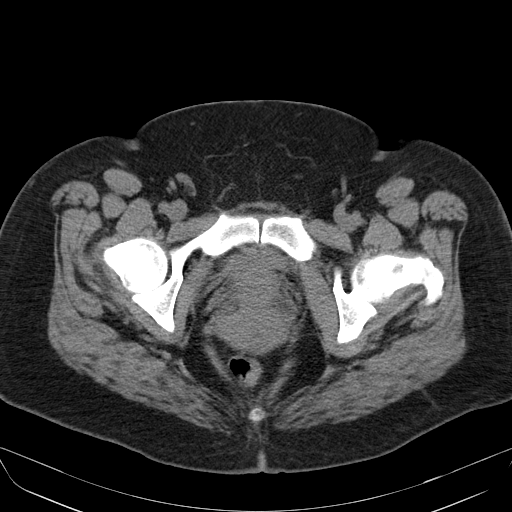
[im 18/90  soft-tissue]
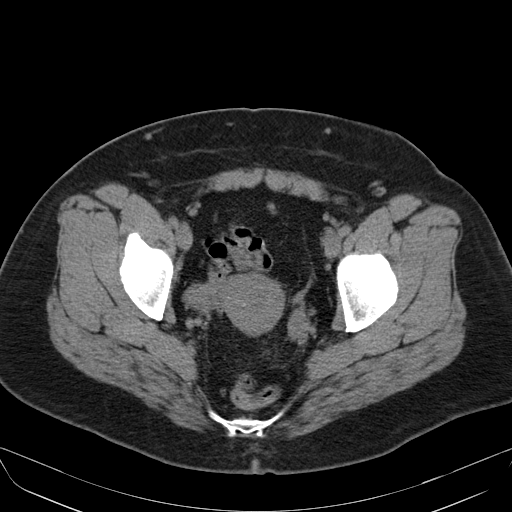
[im 25/90  soft-tissue]
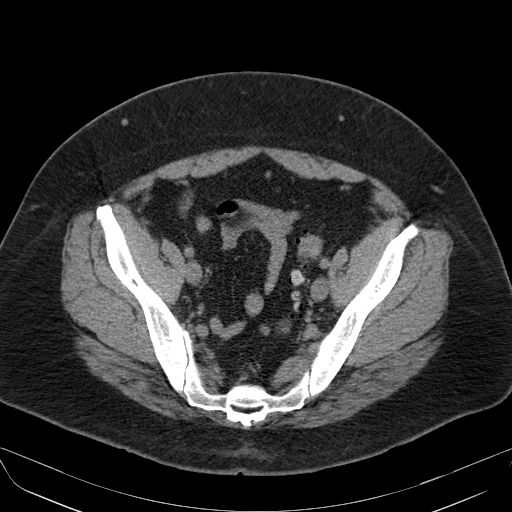
[im 33/90  soft-tissue]
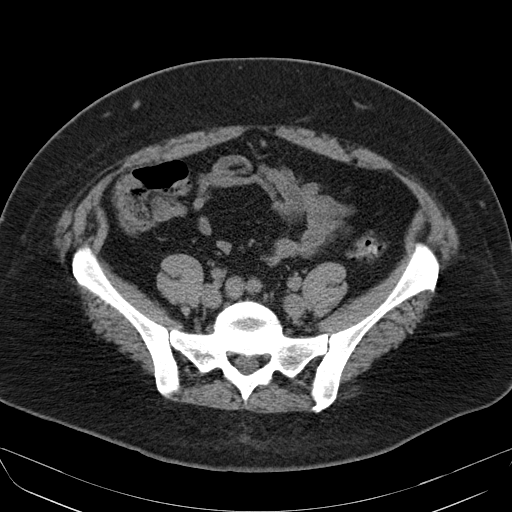
[im 40/90  soft-tissue]
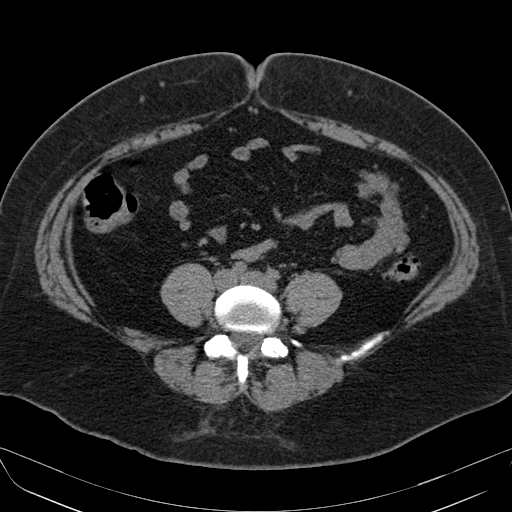
[im 47/90  soft-tissue]
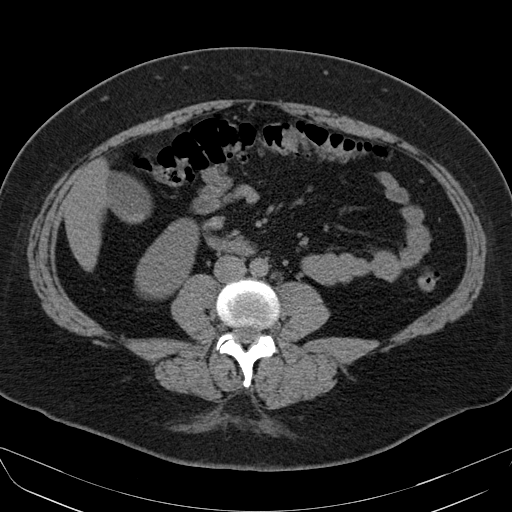
[im 50/90  soft-tissue]
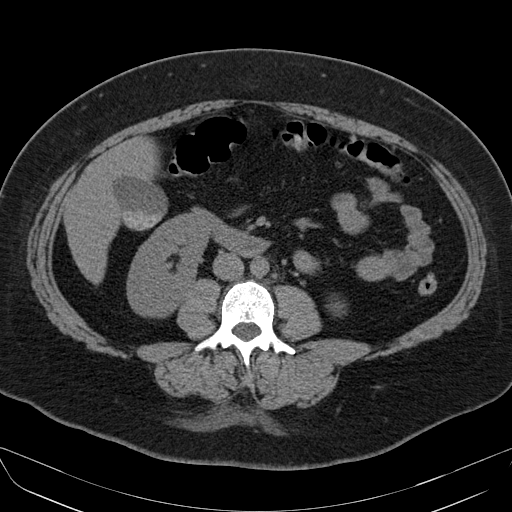
[im 57/90  soft-tissue]
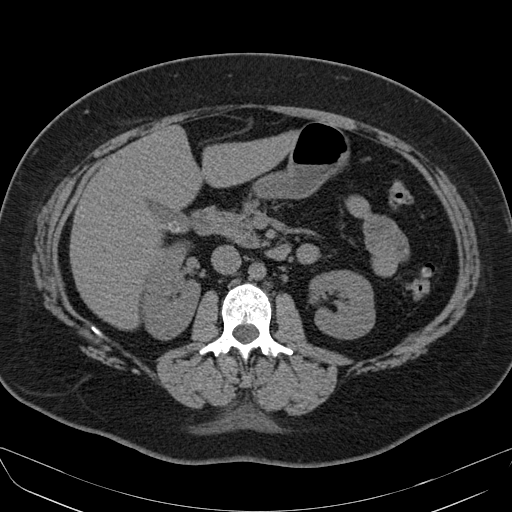
[im 57/90  bone]
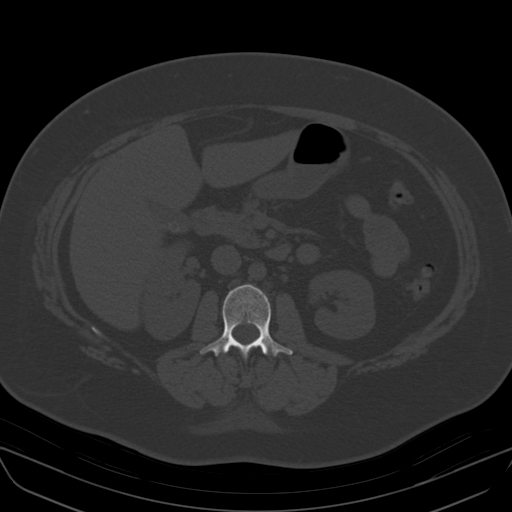
[im 65/90  soft-tissue]
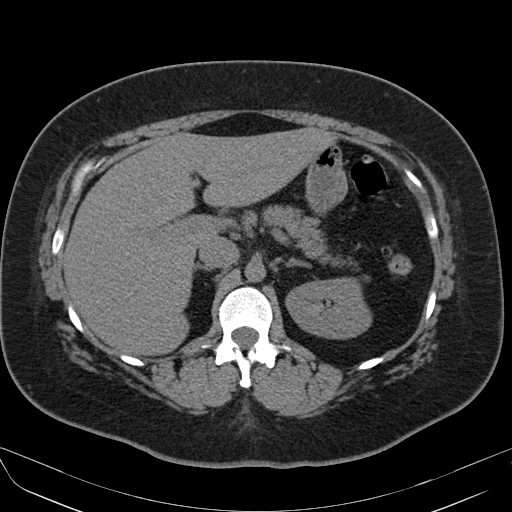
[im 72/90  soft-tissue]
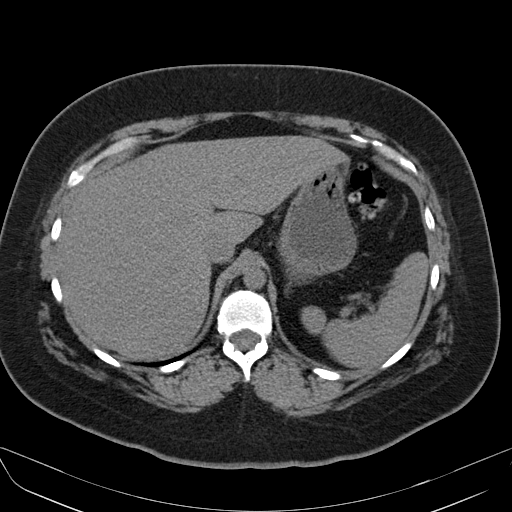
[im 79/90  soft-tissue]
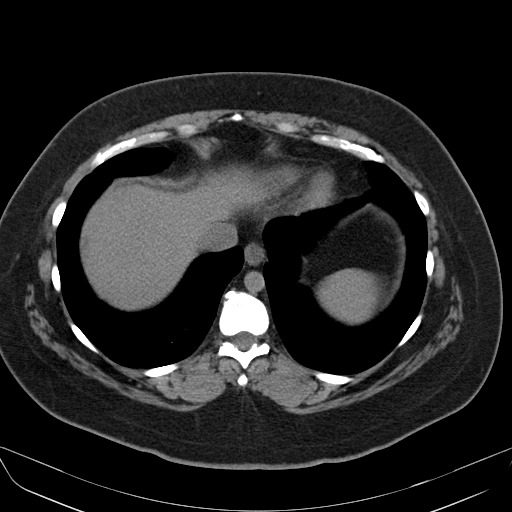
[im 86/90  soft-tissue]
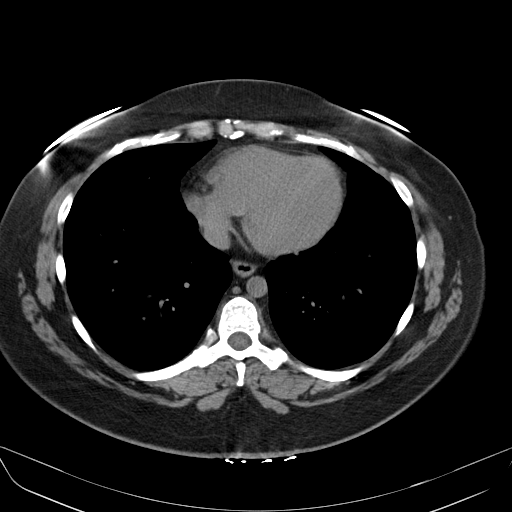

[Series 602: coronal · coronal · 0.88mm/px · 3 of 123 slices shown]
[im 41/123  soft-tissue]
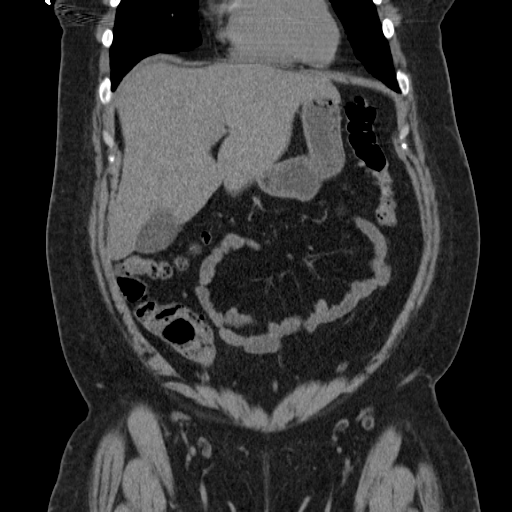
[im 55/123  soft-tissue]
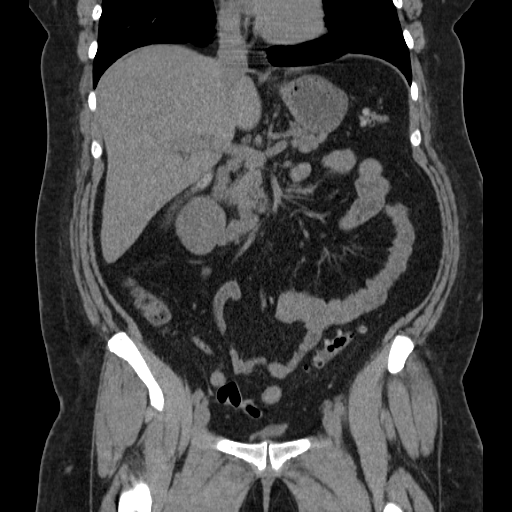
[im 68/123  soft-tissue]
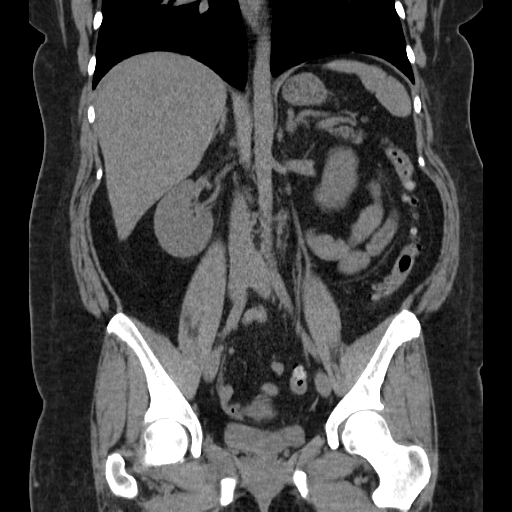

[16 of 46 positions shown; findings below may reference images not displayed]

FINDINGS: Lower chest:  Lung bases are clear.

Hepatobiliary: Liver is prominent, measuring 20.0 cm in length. No
focal liver lesions are identified on this noncontrast enhanced
study. There are multiple gallstones within the gallbladder. The
gallbladder wall is not appreciably thickened. There is no biliary
duct dilatation.

Pancreas: No mass or inflammatory focus.

Spleen: No splenic lesions are appreciable.

Adrenals/Urinary Tract: Right adrenal appears normal. There is a
x 0.9 cm medial left adrenal adenoma. Kidneys bilaterally show no
mass or hydronephrosis on either side. There is no renal or ureteral
calculus on either side. Urinary bladder is midline with wall
thickness within normal limits.

Stomach/Bowel: There are multiple descending colonic and sigmoid
diverticula without demonstrable diverticulitis. There is no
appreciable bowel wall or mesenteric thickening. No bowel
obstruction. No free air or portal venous air.

Vascular/Lymphatic: There is no demonstrable abdominal aortic
aneurysm. No vascular lesions are identified on this noncontrast
enhanced study. There is no appreciable adenopathy in the abdomen or
pelvis.

Reproductive: Uterus is anteverted. There is a paraovarian cyst
posterior to the left ovary measuring 2.1 x 2.1 cm. No other pelvic
mass.

Other: The appendix appears normal. No abscess or ascites is seen in
the abdomen or pelvis. There is a minimal ventral hernia containing
only fat.

Musculoskeletal: There are no blastic or lytic bone lesions. No
intramuscular lesions are identified. Soft tissue stranding is noted
in the fatty tissues posterior to the lumbar region without focal
mass or fluid collection.
IMPRESSION: Cholelithiasis. Prominent liver without focal lesion on this
noncontrast enhanced study.

Small left adrenal adenoma, a benign finding.

Multiple colonic diverticula without demonstrable diverticulitis. No
bowel obstruction. No abscess. Appendix appears normal.

Small left ovarian paraovarian cyst.

Nonspecific soft tissue stranding in the fat posterior to the lumbar
spine. This finding may be secondary to nonspecific inflammation or
previous trauma. There is no well-defined fluid collection or
hematoma in this area.

## 2019-08-24 MED ORDER — DSS 100 MG PO CAPS
100.00 | ORAL_CAPSULE | ORAL | Status: DC
Start: ? — End: 2019-08-24

## 2019-08-24 MED ORDER — BUPROPION HCL 75 MG PO TABS
150.00 | ORAL_TABLET | ORAL | Status: DC
Start: 2019-08-24 — End: 2019-08-24

## 2019-08-24 MED ORDER — INSULIN GLARGINE 100 UNIT/ML ~~LOC~~ SOLN
50.00 | SUBCUTANEOUS | Status: DC
Start: 2019-08-24 — End: 2019-08-24

## 2019-08-24 MED ORDER — INSULIN LISPRO 100 UNIT/ML ~~LOC~~ SOLN
16.00 | SUBCUTANEOUS | Status: DC
Start: 2019-08-24 — End: 2019-08-24

## 2019-08-24 MED ORDER — MAGNESIUM HYDROXIDE 400 MG/5ML PO SUSP
15.00 | ORAL | Status: DC
Start: ? — End: 2019-08-24

## 2019-08-24 MED ORDER — POLYETHYLENE GLYCOL 3350 17 GM/SCOOP PO POWD
17.00 | ORAL | Status: DC
Start: ? — End: 2019-08-24

## 2019-08-24 MED ORDER — DIPHENHYDRAMINE HCL 25 MG PO CAPS
25.00 | ORAL_CAPSULE | ORAL | Status: DC
Start: ? — End: 2019-08-24

## 2019-08-24 MED ORDER — ASPIRIN 81 MG PO CHEW
81.00 | CHEWABLE_TABLET | ORAL | Status: DC
Start: 2019-08-25 — End: 2019-08-24

## 2019-08-24 MED ORDER — CALCIUM CARBONATE ANTACID 750 MG PO CHEW
CHEWABLE_TABLET | ORAL | Status: DC
Start: ? — End: 2019-08-24

## 2019-08-24 MED ORDER — PNV PRENATAL PLUS MULTIVITAMIN 27-1 MG PO TABS
1.00 | ORAL_TABLET | ORAL | Status: DC
Start: 2019-08-25 — End: 2019-08-24

## 2019-08-24 MED ORDER — FOLIC ACID 1 MG PO TABS
1.00 | ORAL_TABLET | ORAL | Status: DC
Start: 2019-08-25 — End: 2019-08-24

## 2019-08-24 MED ORDER — METFORMIN HCL 500 MG PO TABS
500.00 | ORAL_TABLET | ORAL | Status: DC
Start: 2019-08-24 — End: 2019-08-24

## 2019-08-24 MED ORDER — FAMOTIDINE 20 MG PO TABS
20.00 | ORAL_TABLET | ORAL | Status: DC
Start: 2019-08-25 — End: 2019-08-24

## 2019-08-24 MED ORDER — ONDANSETRON HCL 4 MG/2ML IJ SOLN
4.00 | INTRAMUSCULAR | Status: DC
Start: ? — End: 2019-08-24

## 2019-08-27 MED ORDER — NIFEDIPINE ER OSMOTIC RELEASE 30 MG PO TB24
30.00 | ORAL_TABLET | ORAL | Status: DC
Start: 2019-08-26 — End: 2019-08-27

## 2019-08-27 MED ORDER — ONDANSETRON HCL 4 MG/2ML IJ SOLN
4.00 | INTRAMUSCULAR | Status: DC
Start: ? — End: 2019-08-27

## 2019-08-27 MED ORDER — WITCH HAZEL-GLYCERIN EX PADS
1.00 | MEDICATED_PAD | CUTANEOUS | Status: DC
Start: ? — End: 2019-08-27

## 2019-08-27 MED ORDER — PNV PRENATAL PLUS MULTIVITAMIN 27-1 MG PO TABS
1.00 | ORAL_TABLET | ORAL | Status: DC
Start: 2019-08-28 — End: 2019-08-27

## 2019-08-27 MED ORDER — DIPHENHYDRAMINE HCL 25 MG PO CAPS
25.00 | ORAL_CAPSULE | ORAL | Status: DC
Start: ? — End: 2019-08-27

## 2019-08-27 MED ORDER — BENZOCAINE 20 % EX AERO
1.00 | INHALATION_SPRAY | CUTANEOUS | Status: DC
Start: ? — End: 2019-08-27

## 2019-08-27 MED ORDER — ENOXAPARIN SODIUM 60 MG/0.6ML ~~LOC~~ SOLN
60.00 | SUBCUTANEOUS | Status: DC
Start: 2019-08-28 — End: 2019-08-27

## 2019-08-27 MED ORDER — ACETAMINOPHEN 325 MG PO TABS
650.00 | ORAL_TABLET | ORAL | Status: DC
Start: ? — End: 2019-08-27

## 2019-08-27 MED ORDER — DSS 100 MG PO CAPS
100.00 | ORAL_CAPSULE | ORAL | Status: DC
Start: 2019-08-27 — End: 2019-08-27

## 2019-08-27 MED ORDER — IBUPROFEN 600 MG PO TABS
600.00 | ORAL_TABLET | ORAL | Status: DC
Start: ? — End: 2019-08-27

## 2019-08-27 MED ORDER — SIMETHICONE 80 MG PO CHEW
80.00 | CHEWABLE_TABLET | ORAL | Status: DC
Start: ? — End: 2019-08-27

## 2019-08-27 MED ORDER — FOLIC ACID 1 MG PO TABS
1.00 | ORAL_TABLET | ORAL | Status: DC
Start: 2019-08-28 — End: 2019-08-27

## 2019-08-27 MED ORDER — ASPIRIN 81 MG PO CHEW
81.00 | CHEWABLE_TABLET | ORAL | Status: DC
Start: 2019-08-27 — End: 2019-08-27

## 2019-08-27 MED ORDER — BUPROPION HCL 75 MG PO TABS
150.00 | ORAL_TABLET | ORAL | Status: DC
Start: 2019-08-27 — End: 2019-08-27

## 2019-08-27 MED ORDER — FAMOTIDINE 20 MG PO TABS
20.00 | ORAL_TABLET | ORAL | Status: DC
Start: 2019-08-28 — End: 2019-08-27

## 2019-08-27 MED ORDER — ALUM & MAG HYDROXIDE-SIMETH 400-400-40 MG/5ML PO SUSP
15.00 | ORAL | Status: DC
Start: ? — End: 2019-08-27

## 2019-08-27 MED ORDER — MAGNESIUM SULFATE 40 GM/1000ML IV SOLN
2.00 | INTRAVENOUS | Status: DC
Start: ? — End: 2019-08-27
